# Patient Record
Sex: Female | Born: 1970 | Race: Black or African American | Hispanic: No | Marital: Single | Smoking: Current every day smoker
Health system: Southern US, Community
[De-identification: ages and names within clinical notes are randomized; demographics above are authoritative.]

## PROBLEM LIST (undated history)

## (undated) ENCOUNTER — Emergency Department (HOSPITAL_COMMUNITY): Payer: Self-pay | Source: Home / Self Care

## (undated) DIAGNOSIS — A599 Trichomoniasis, unspecified: Secondary | ICD-10-CM

## (undated) DIAGNOSIS — N83209 Unspecified ovarian cyst, unspecified side: Secondary | ICD-10-CM

## (undated) DIAGNOSIS — D649 Anemia, unspecified: Secondary | ICD-10-CM

## (undated) DIAGNOSIS — D259 Leiomyoma of uterus, unspecified: Secondary | ICD-10-CM

## (undated) DIAGNOSIS — A549 Gonococcal infection, unspecified: Secondary | ICD-10-CM

## (undated) HISTORY — PX: DILATION AND CURETTAGE OF UTERUS: SHX78

## (undated) HISTORY — DX: Unspecified ovarian cyst, unspecified side: N83.209

## (undated) HISTORY — DX: Leiomyoma of uterus, unspecified: D25.9

---

## 1997-06-17 ENCOUNTER — Encounter: Admission: RE | Admit: 1997-06-17 | Discharge: 1997-06-17 | Payer: Self-pay | Admitting: Family Medicine

## 1998-06-15 ENCOUNTER — Ambulatory Visit (HOSPITAL_COMMUNITY): Admission: AD | Admit: 1998-06-15 | Discharge: 1998-06-15 | Payer: Self-pay | Admitting: *Deleted

## 1998-07-07 ENCOUNTER — Other Ambulatory Visit: Admission: RE | Admit: 1998-07-07 | Discharge: 1998-07-07 | Payer: Self-pay | Admitting: *Deleted

## 1998-10-07 ENCOUNTER — Encounter (INDEPENDENT_AMBULATORY_CARE_PROVIDER_SITE_OTHER): Payer: Self-pay | Admitting: *Deleted

## 1998-10-07 LAB — CONVERTED CEMR LAB

## 1999-06-14 ENCOUNTER — Encounter: Admission: RE | Admit: 1999-06-14 | Discharge: 1999-06-14 | Payer: Self-pay | Admitting: Family Medicine

## 1999-06-20 ENCOUNTER — Encounter: Admission: RE | Admit: 1999-06-20 | Discharge: 1999-06-20 | Payer: Self-pay | Admitting: Family Medicine

## 1999-06-22 ENCOUNTER — Encounter: Admission: RE | Admit: 1999-06-22 | Discharge: 1999-06-22 | Payer: Self-pay | Admitting: Family Medicine

## 1999-07-24 ENCOUNTER — Encounter: Admission: RE | Admit: 1999-07-24 | Discharge: 1999-07-24 | Payer: Self-pay | Admitting: Sports Medicine

## 1999-08-21 ENCOUNTER — Encounter: Payer: Self-pay | Admitting: Emergency Medicine

## 1999-08-21 ENCOUNTER — Emergency Department (HOSPITAL_COMMUNITY): Admission: EM | Admit: 1999-08-21 | Discharge: 1999-08-21 | Payer: Self-pay | Admitting: Emergency Medicine

## 2002-08-11 ENCOUNTER — Emergency Department (HOSPITAL_COMMUNITY): Admission: EM | Admit: 2002-08-11 | Discharge: 2002-08-11 | Payer: Self-pay | Admitting: Emergency Medicine

## 2002-10-02 ENCOUNTER — Inpatient Hospital Stay (HOSPITAL_COMMUNITY): Admission: AD | Admit: 2002-10-02 | Discharge: 2002-10-03 | Payer: Self-pay | Admitting: *Deleted

## 2002-10-02 ENCOUNTER — Encounter (INDEPENDENT_AMBULATORY_CARE_PROVIDER_SITE_OTHER): Payer: Self-pay

## 2002-10-02 ENCOUNTER — Encounter: Payer: Self-pay | Admitting: *Deleted

## 2004-08-22 ENCOUNTER — Emergency Department (HOSPITAL_COMMUNITY): Admission: EM | Admit: 2004-08-22 | Discharge: 2004-08-23 | Payer: Self-pay | Admitting: Emergency Medicine

## 2005-09-04 ENCOUNTER — Emergency Department (HOSPITAL_COMMUNITY): Admission: EM | Admit: 2005-09-04 | Discharge: 2005-09-04 | Payer: Self-pay | Admitting: Emergency Medicine

## 2005-11-28 ENCOUNTER — Encounter (INDEPENDENT_AMBULATORY_CARE_PROVIDER_SITE_OTHER): Payer: Self-pay | Admitting: Family Medicine

## 2005-11-28 ENCOUNTER — Ambulatory Visit: Payer: Self-pay | Admitting: Family Medicine

## 2005-11-29 ENCOUNTER — Ambulatory Visit: Payer: Self-pay | Admitting: *Deleted

## 2006-04-05 ENCOUNTER — Encounter (INDEPENDENT_AMBULATORY_CARE_PROVIDER_SITE_OTHER): Payer: Self-pay | Admitting: *Deleted

## 2006-10-23 ENCOUNTER — Encounter (INDEPENDENT_AMBULATORY_CARE_PROVIDER_SITE_OTHER): Payer: Self-pay | Admitting: *Deleted

## 2006-10-30 DIAGNOSIS — R109 Unspecified abdominal pain: Secondary | ICD-10-CM

## 2006-10-30 HISTORY — DX: Unspecified abdominal pain: R10.9

## 2007-04-05 ENCOUNTER — Emergency Department (HOSPITAL_COMMUNITY): Admission: EM | Admit: 2007-04-05 | Discharge: 2007-04-05 | Payer: Self-pay | Admitting: Emergency Medicine

## 2008-06-15 ENCOUNTER — Emergency Department (HOSPITAL_COMMUNITY): Admission: EM | Admit: 2008-06-15 | Discharge: 2008-06-15 | Payer: Self-pay | Admitting: Emergency Medicine

## 2009-05-13 ENCOUNTER — Emergency Department (HOSPITAL_COMMUNITY): Admission: EM | Admit: 2009-05-13 | Discharge: 2009-05-13 | Payer: Self-pay | Admitting: Emergency Medicine

## 2009-09-02 ENCOUNTER — Emergency Department (HOSPITAL_COMMUNITY): Admission: EM | Admit: 2009-09-02 | Discharge: 2009-09-02 | Payer: Self-pay | Admitting: Emergency Medicine

## 2010-04-22 LAB — BASIC METABOLIC PANEL
CO2: 24 mEq/L (ref 19–32)
Calcium: 8.3 mg/dL — ABNORMAL LOW (ref 8.4–10.5)
GFR calc Af Amer: >60 mL/min (ref >60)
Potassium: 3.4 mEq/L — ABNORMAL LOW (ref 3.5–5.1)

## 2010-04-22 LAB — DIFFERENTIAL
Lymphocytes Relative: 8 % — ABNORMAL LOW (ref 12–46)
Lymphs Abs: 0.9 10*3/uL (ref 0.7–4.0)
Monocytes Relative: 3 % (ref 3–12)
Neutrophils Relative %: 89 % — ABNORMAL HIGH (ref 43–77)

## 2010-04-22 LAB — CBC
HCT: 35.6 % — ABNORMAL LOW (ref 36.0–46.0)
MCH: 28.4 pg (ref 26.0–34.0)
MCHC: 33.1 g/dL (ref 30.0–36.0)
RBC: 4.16 MIL/uL (ref 3.87–5.11)
WBC: 11.2 10*3/uL — ABNORMAL HIGH (ref 4.0–10.5)

## 2010-04-22 LAB — URINALYSIS, ROUTINE W REFLEX MICROSCOPIC
Bilirubin Urine: NEGATIVE
Glucose, UA: NEGATIVE mg/dL
Ketones, ur: NEGATIVE mg/dL
Leukocytes, UA: NEGATIVE
Nitrite: NEGATIVE
Urobilinogen, UA: 0.2 mg/dL (ref 0.0–1.0)
pH: 5 (ref 5.0–8.0)

## 2010-04-22 LAB — WET PREP, GENITAL
Clue Cells Wet Prep HPF POC: NONE SEEN
Yeast Wet Prep HPF POC: NONE SEEN

## 2010-04-22 LAB — GC/CHLAMYDIA PROBE AMP, GENITAL: GC Probe Amp, Genital: NEGATIVE

## 2010-04-22 LAB — URINE MICROSCOPIC-ADD ON

## 2010-04-22 LAB — POCT PREGNANCY, URINE: Preg Test, Ur: NEGATIVE

## 2010-05-16 LAB — POCT I-STAT, CHEM 8
Creatinine, Ser: 1 mg/dL (ref 0.4–1.2)
Hemoglobin: 12.9 g/dL (ref 12.0–15.0)
Potassium: 4 mEq/L (ref 3.5–5.1)
TCO2: 26 mmol/L (ref 0–100)

## 2010-05-16 LAB — URINALYSIS, ROUTINE W REFLEX MICROSCOPIC
Bilirubin Urine: NEGATIVE
Nitrite: NEGATIVE
Specific Gravity, Urine: 1.01 (ref 1.005–1.030)
Urobilinogen, UA: 0.2 mg/dL (ref 0.0–1.0)
pH: 6.5 (ref 5.0–8.0)

## 2010-05-16 LAB — DIFFERENTIAL
Basophils Absolute: 0 10*3/uL (ref 0.0–0.1)
Eosinophils Relative: 1 % (ref 0–5)
Lymphocytes Relative: 22 % (ref 12–46)
Lymphs Abs: 1.7 10*3/uL (ref 0.7–4.0)
Monocytes Absolute: 0.3 10*3/uL (ref 0.1–1.0)
Monocytes Relative: 4 % (ref 3–12)

## 2010-05-16 LAB — CBC
Hemoglobin: 11.1 g/dL — ABNORMAL LOW (ref 12.0–15.0)
RDW: 17.9 % — ABNORMAL HIGH (ref 11.5–15.5)

## 2011-01-10 ENCOUNTER — Encounter (HOSPITAL_COMMUNITY): Payer: Self-pay | Admitting: *Deleted

## 2011-01-10 ENCOUNTER — Inpatient Hospital Stay (HOSPITAL_COMMUNITY): Payer: Self-pay

## 2011-01-10 ENCOUNTER — Inpatient Hospital Stay (HOSPITAL_COMMUNITY)
Admission: AD | Admit: 2011-01-10 | Discharge: 2011-01-10 | Disposition: A | Discharge status: Home / Self Care | Payer: Self-pay | Source: Ambulatory Visit | Attending: Obstetrics & Gynecology | Admitting: Obstetrics & Gynecology

## 2011-01-10 DIAGNOSIS — N83209 Unspecified ovarian cyst, unspecified side: Secondary | ICD-10-CM

## 2011-01-10 DIAGNOSIS — N760 Acute vaginitis: Secondary | ICD-10-CM

## 2011-01-10 DIAGNOSIS — N83201 Unspecified ovarian cyst, right side: Secondary | ICD-10-CM

## 2011-01-10 DIAGNOSIS — N8300 Follicular cyst of ovary, unspecified side: Secondary | ICD-10-CM | POA: Insufficient documentation

## 2011-01-10 DIAGNOSIS — D259 Leiomyoma of uterus, unspecified: Secondary | ICD-10-CM

## 2011-01-10 DIAGNOSIS — R109 Unspecified abdominal pain: Secondary | ICD-10-CM | POA: Insufficient documentation

## 2011-01-10 DIAGNOSIS — A499 Bacterial infection, unspecified: Secondary | ICD-10-CM | POA: Insufficient documentation

## 2011-01-10 DIAGNOSIS — B9689 Other specified bacterial agents as the cause of diseases classified elsewhere: Secondary | ICD-10-CM | POA: Insufficient documentation

## 2011-01-10 HISTORY — DX: Trichomoniasis, unspecified: A59.9

## 2011-01-10 HISTORY — DX: Gonococcal infection, unspecified: A54.9

## 2011-01-10 LAB — WET PREP, GENITAL
Trich, Wet Prep: NONE SEEN
Yeast Wet Prep HPF POC: NONE SEEN

## 2011-01-10 LAB — URINALYSIS, ROUTINE W REFLEX MICROSCOPIC
Bilirubin Urine: NEGATIVE
Glucose, UA: NEGATIVE mg/dL
Ketones, ur: NEGATIVE mg/dL
Nitrite: NEGATIVE
Protein, ur: NEGATIVE mg/dL
Urobilinogen, UA: 0.2 mg/dL (ref 0.0–1.0)

## 2011-01-10 LAB — URINE MICROSCOPIC-ADD ON

## 2011-01-10 MED ORDER — KETOROLAC TROMETHAMINE 60 MG/2ML IM SOLN
60.0000 mg | Freq: Once | INTRAMUSCULAR | Status: AC
Start: 2011-01-10 — End: 2011-01-10
  Administered 2011-01-10: 60 mg via INTRAMUSCULAR
  Filled 2011-01-10: qty 2

## 2011-01-10 MED ORDER — METRONIDAZOLE 500 MG PO TABS: 500.0000 mg | ORAL_TABLET | Freq: Two times a day (BID) | ORAL | Status: AC

## 2011-01-10 MED ORDER — IBUPROFEN 600 MG PO TABS: 600.0000 mg | ORAL_TABLET | Freq: Four times a day (QID) | ORAL | Status: AC | PRN

## 2011-01-10 MED ORDER — OXYCODONE-ACETAMINOPHEN 5-325 MG PO TABS: 2.0000 | ORAL_TABLET | ORAL | Status: AC | PRN

## 2011-01-10 NOTE — ED Provider Notes (Signed)
History     Chief Complaint  Patient presents with  . Abdominal Pain   HPI Elaine Blake 40 y.o. Having abdominal pain.  Has known fibroids and has not seen a doctor for them in several years.  Was in ER in July 2011.  Was advised to follow up with OB/GYN but did not.  Thinks this pain is her fibroids again.  Took aspirin yesterday.  No pain medication today.  Is rocking in bed currently.  Same partner for past 5 years.  OB History    Grav Para Term Preterm Abortions TAB SAB Ect Mult Living   6 1 1  0 4 2 2  0 0 1      Past Medical History  Diagnosis Date  . Gonorrhea   . Trichomonas     Past Surgical History  Procedure Date  . Dilation and curettage of uterus     Family History  Problem Relation Age of Onset  . Anesthesia problems Neg Hx     History  Substance Use Topics  . Smoking status: Current Everyday Smoker -- 0.5 packs/day  . Smokeless tobacco: Never Used  . Alcohol Use: No    Allergies:  Allergies  Allergen Reactions  . Latex     Fever blisters on mouth    Prescriptions prior to admission  Medication Sig Dispense Refill  . aspirin 325 MG EC tablet Take 650 mg by mouth daily as needed. Pain        . ibuprofen (ADVIL,MOTRIN) 200 MG tablet Take 400 mg by mouth every 6 (six) hours as needed. pain       . Multiple Vitamins-Iron (MULTIVITAMIN/IRON) TABS Take 1 tablet by mouth daily.          Review of Systems  Gastrointestinal: Positive for abdominal pain.       No vaginal bleeding Vaginal discharge x 2 weeks   Physical Exam   Blood pressure 136/97, pulse 73, temperature 99 F (37.2 C), temperature source Oral, resp. rate 18, height 5' 2.5" (1.588 m), weight 163 lb 9.6 oz (74.208 kg), last menstrual period 01/02/2011.  Physical Exam  Nursing note and vitals reviewed. Constitutional: She is oriented to person, place, and time. She appears well-developed and well-nourished.  HENT:  Head: Normocephalic.  Eyes: EOM are normal.  Neck: Neck supple.   GI: Soft. There is tenderness.       Fundus to umbilicus Abdomen tender to palpation in RLQ  Genitourinary:       Speculum exam: Vulva - negative Vagina - Small amount of creamy discharge, odor noted Cervix - No contact bleeding Bimanual exam: Cervix closed Uterus mildly tender, enlarged Adnexa right very tender GC/Chlam, wet prep done Chaperone present for exam.  Musculoskeletal: Normal range of motion.  Neurological: She is alert and oriented to person, place, and time.  Skin: Skin is warm and dry.  Psychiatric: She has a normal mood and affect.    MAU Course  Procedures  MDM Ultrasound Clinical Data: Pelvic pain and cramping. Endometriosis. LMP  01/04/2011.  TRANSABDOMINAL AND TRANSVAGINAL ULTRASOUND OF PELVIS  Technique: Both transabdominal and transvaginal ultrasound  examinations of the pelvis were performed. Transabdominal  technique was performed for global imaging of the pelvis including  uterus, ovaries, adnexal regions, and pelvic cul-de-sac.  It was necessary to proceed with endovaginal exam following the  transabdominal exam to visualize the endometrium and ovaries.  Comparison: 09/02/2009  Findings:  Uterus: 9.7 x 6.5 x 6.8 cm. A heterogeneous myometrial mass is  seen in the posterior uterine body which measures approximately 5.5  x 6.2 by 5.8 cm. This shows mild increase in size since previous  study, and mayrepresent a fibroid or adenomyoma.  Endometrium: Double layer thickness measures 8 mm transvaginally.  Right ovary: 3.0 x 1.9 x 2.9 cm. A benign hemorrhagic cyst  containing a hematocrit level is seen which measures 2.0 x 2.5 x  2.6 cm.  Left ovary: 3.8 x 2.2 x 2.6 cm. Normal appearance.  Other Findings: No free fluid  IMPRESSION:  1. Mild enlargement of 6 cm posterior uterine fibroid versus  adenomyoma. Pelvic MRI could be performed for further  characterization if clinically warranted.  2. 2.6 cm right ovarian hemorrhagic cyst.  3. Normal  appearance of the left ovary.  Results for orders placed during the hospital encounter of 01/10/11 (from the past 24 hour(s))  POCT PREGNANCY, URINE     Status: Normal   Collection Time   01/10/11  2:23 PM      Component Value Range   Preg Test, Ur NEGATIVE    URINALYSIS, ROUTINE W REFLEX MICROSCOPIC     Status: Abnormal   Collection Time   01/10/11  2:40 PM      Component Value Range   Color, Urine YELLOW  YELLOW    APPearance HAZY (*) CLEAR    Specific Gravity, Urine 1.025  1.005 - 1.030    pH 5.5  5.0 - 8.0    Glucose, UA NEGATIVE  NEGATIVE (mg/dL)   Hgb urine dipstick MODERATE (*) NEGATIVE    Bilirubin Urine NEGATIVE  NEGATIVE    Ketones, ur NEGATIVE  NEGATIVE (mg/dL)   Protein, ur NEGATIVE  NEGATIVE (mg/dL)   Urobilinogen, UA 0.2  0.0 - 1.0 (mg/dL)   Nitrite NEGATIVE  NEGATIVE    Leukocytes, UA NEGATIVE  NEGATIVE   URINE MICROSCOPIC-ADD ON     Status: Abnormal   Collection Time   01/10/11  2:40 PM      Component Value Range   Squamous Epithelial / LPF MANY (*) RARE    RBC / HPF 0-2  <3 (RBC/hpf)   Bacteria, UA FEW (*) RARE   WET PREP, GENITAL     Status: Abnormal   Collection Time   01/10/11  4:40 PM      Component Value Range   Yeast, Wet Prep NONE SEEN  NONE SEEN    Trich, Wet Prep NONE SEEN  NONE SEEN    Clue Cells, Wet Prep MODERATE (*) NONE SEEN    WBC, Wet Prep HPF POC MANY (*) NONE SEEN     Assessment and Plan  Uterine fibroid Right ovarian hemorrhagic cyst Bacterial vaginosis  Plan Will prescribe ibuprofen pain management and percocet for severe pain Will send message to GYN clinic for follow up appointment Encouraged client to keep appointment and be seen for a management plan.  Elaine Blake 01/10/2011, 3:25 PM   Nolene Bernheim, NP 01/10/11 1710

## 2011-01-10 NOTE — Progress Notes (Signed)
Nausea earlier today.  No other GI problems. Denies any urinary problems.  Denies hx of kidney stone.  Neg CVA tenderness.

## 2011-01-10 NOTE — Progress Notes (Signed)
Rt lower quad pain.  Sharp shooting pain, into low back.  Not as bad now as it was last night.

## 2011-02-07 ENCOUNTER — Encounter: Payer: Self-pay | Admitting: Obstetrics & Gynecology

## 2011-03-01 ENCOUNTER — Ambulatory Visit (INDEPENDENT_AMBULATORY_CARE_PROVIDER_SITE_OTHER): Payer: Self-pay | Admitting: Obstetrics and Gynecology

## 2011-03-01 ENCOUNTER — Encounter: Payer: Self-pay | Admitting: Obstetrics and Gynecology

## 2011-03-01 ENCOUNTER — Other Ambulatory Visit (HOSPITAL_COMMUNITY)
Admission: RE | Admit: 2011-03-01 | Discharge: 2011-03-01 | Disposition: A | Discharge status: Home / Self Care | Payer: Self-pay | Source: Ambulatory Visit | Attending: Obstetrics and Gynecology | Admitting: Obstetrics and Gynecology

## 2011-03-01 DIAGNOSIS — D259 Leiomyoma of uterus, unspecified: Secondary | ICD-10-CM | POA: Insufficient documentation

## 2011-03-01 DIAGNOSIS — N920 Excessive and frequent menstruation with regular cycle: Secondary | ICD-10-CM

## 2011-03-01 DIAGNOSIS — N946 Dysmenorrhea, unspecified: Secondary | ICD-10-CM

## 2011-03-01 DIAGNOSIS — Z01419 Encounter for gynecological examination (general) (routine) without abnormal findings: Secondary | ICD-10-CM

## 2011-03-01 DIAGNOSIS — Z01812 Encounter for preprocedural laboratory examination: Secondary | ICD-10-CM

## 2011-03-01 LAB — POCT PREGNANCY, URINE: Preg Test, Ur: NEGATIVE

## 2011-03-01 NOTE — Progress Notes (Signed)
Subjective:    Patient ID: Elaine Blake, female    DOB: [birth year], 41 y.o.   MRN: [id number]  HPI  41 yo [id number] with LMP 02/25/2011 and BMI 31 presenting today as MAU follow-up. Patient was seen on December 5 for pelvic pain and diagnosed with fibroid uterus. Patient reports improvement in her pain since the ed visit but pain is worst around the time of her period. Patient reports regular menses but heavy with passage of clots, lasting 6-7 days.   Review of Systems     Objective:   Physical Exam GENERAL: Well-developed, well-nourished female in no acute distress.  HEENT: Normocephalic, atraumatic. Sclerae anicteric.  NECK: Supple. Normal thyroid.  LUNGS: Clear to auscultation bilaterally.  HEART: Regular rate and rhythm. BREASTS: Symmetric in size. No palpable masses or lymphadenopathy, skin changes, or nipple drainage. ABDOMEN: Soft, nontender, nondistended. No organomegaly. PELVIC: Normal external female genitalia. Vagina is pink and rugated.  Normal discharge. Normal appearing cervix. Uterus is normal in size. No adnexal mass or tenderness. EXTREMITIES: No cyanosis, clubbing, or edema, 2+ distal pulses.  01/10/2011 ultrasound: Findings: Uterus: 9.7 x 6.5 x 6.8 cm. A heterogeneous myometrial mass is  seen in the posterior uterine body which measures approximately 5.5 x 6.2 by 5.8 cm. This shows mild increase in size since previous study, and mayrepresent a fibroid or adenomyoma. Endometrium: Double layer thickness measures 8 mm transvaginally. Right ovary: 3.0 x 1.9 x 2.9 cm. A benign hemorrhagic cyst containing a hematocrit level is seen which measures 2.0 x 2.5 x  2.6 cm. Left ovary: 3.8 x 2.2 x 2.6 cm. Normal appearance. Other Findings: No free fluid   IMPRESSION:  1. Mild enlargement of 6 cm posterior uterine fibroid versus  adenomyoma. Pelvic MRI could be performed for further  characterization if clinically warranted.  2. 2.6 cm right ovarian hemorrhagic cyst.  3. Normal  appearance of the left ovary.     Assessment & Plan:  41 yo [phone] with fibroid uterus - pap smear and endometrial biopsy performed ENDOMETRIAL BIOPSY     The indications for endometrial biopsy were reviewed.   Risks of the biopsy including cramping, bleeding, infection, uterine perforation, inadequate specimen and need for additional procedures  were discussed. The patient states she understands and agrees to undergo procedure today. Consent was signed. Time out was performed. Urine HCG was negative. A sterile speculum was placed in the patient's vagina and the cervix was prepped with Betadine. A single-toothed tenaculum was placed on the anterior lip of the cervix to stabilize it. The uterine cavity was sounded to a depth of 10 cm using the uterine sound. The 3 mm pipelle was introduced into the endometrial cavity without difficulty, 1 pass were made.  A  moderate amount of tissue was  sent to pathology. The instruments were removed from the patient's vagina. Minimal bleeding from the cervix was noted. The patient tolerated the procedure well.  Routine post-procedure instructions were given to the patient. The patient will follow up in two weeks to review the results and for further management.   -Treatment options were discussed (medical and surgical). Patient reports that she is actively trying to conceive with new partner and does not desire surgical management. Patient therefore offered medical management with ibuprofen a few days before her menses and during her menses to help alleviate the pain. - Patient advised to take PNV and to return to [place] if 6 months of infertility has occurred - patient will return in a month for  results and follow-up on her dysmenorrhea

## 2011-03-01 NOTE — Progress Notes (Signed)
Ibuprofen 600 mg given postprocedure for cramping=5 at 4:07 pm

## 2011-03-05 ENCOUNTER — Telehealth: Payer: Self-pay | Admitting: *Deleted

## 2011-03-05 NOTE — Telephone Encounter (Signed)
Pt left message stating that she was here on 03/01/11. Now she is having d/c and pain. Please call back.

## 2011-03-06 NOTE — Telephone Encounter (Signed)
Returned pt call- left message that I am calling to check on her status and what advice I might be able to provide her. Please call back with a new message to the nurse voice mail.

## 2011-03-08 NOTE — Telephone Encounter (Signed)
Called patient and left message stating this is the second time we have returned your call, if you still need assistance please call back and leave another message with how we may help you and best number to reach you.

## 2011-03-30 ENCOUNTER — Ambulatory Visit (INDEPENDENT_AMBULATORY_CARE_PROVIDER_SITE_OTHER): Payer: Self-pay | Admitting: Obstetrics and Gynecology

## 2011-03-30 ENCOUNTER — Encounter: Payer: Self-pay | Admitting: Obstetrics and Gynecology

## 2011-03-30 DIAGNOSIS — N920 Excessive and frequent menstruation with regular cycle: Secondary | ICD-10-CM

## 2011-03-30 DIAGNOSIS — D259 Leiomyoma of uterus, unspecified: Secondary | ICD-10-CM

## 2011-03-30 DIAGNOSIS — N946 Dysmenorrhea, unspecified: Secondary | ICD-10-CM

## 2011-03-30 MED ORDER — IBUPROFEN 800 MG PO TABS: 800.0000 mg | ORAL_TABLET | Freq: Three times a day (TID) | ORAL | Status: AC | PRN

## 2011-03-30 MED ORDER — OXYCODONE-ACETAMINOPHEN 5-325 MG PO TABS: 1.0000 | ORAL_TABLET | ORAL | Status: AC | PRN

## 2011-03-30 NOTE — Progress Notes (Signed)
41 yo [id number] with dysmenorrhea and fibroid uterus presenting today for follow-up. Results of endometrial biopsy discussed and explained to the patient. Patient reports persistent pain not relieved with ibuprofen and is ready for surgical intervention. Patient desires to conceive and therefore is interested in myomectomy. Risk, benefits and alternatives were explained including but not limited to risk of bleeding, infection and damage to adjacent organs. Patient verbalized understanding. Patient will be scheduled for myomectomy. Rx percocet and 800mg  Ibuprofen provided

## 2011-03-30 NOTE — Patient Instructions (Signed)
Myomectomy     Myoma is a non-cancerous tumor made up of fibrous tissue. It is also called leiomyoma, but more often called a fibroid tumor. Myomectomy is the removal of a fibroid tumor without removing another organ, like the uterus or ovary, with it. Fibroids range from the size of a pea to a grapefruit. They are rarely cancerous. Myomas only need treatment when they are growing or when they cause symptoms, such aspain, pressure, bleeding, and pain with intercourse.  LET YOUR CAREGIVER KNOW ABOUT:  · Any allergies, especially to medicines.   · If you develop a cold or an infection before your surgery.   · Medicines taken, including vitamins, herbs, eyedrops, over-ther-counter medicines, and creams.   · Use of steroids (by mouth or creams).   · Previous problems with numbing medicines.   · History of blood clots or other bleeding problems.   · Other health problems, such as diabetes, kidney, heart, or lung problems.   · Previous surgery.   · Possibility of pregnancy, if this applies.   RISKS AND COMPLICATIONS   · Excessive bleeding.   · Infection.   · Injury to other organs.   · Blood clots in the legs, chest, and brain.   · Scar tissue (adhesions) on other organs and in the pelvis.   · Death during or after the surgery.   BEFORE THE PROCEDURE  · Follow your caregiver's advice regarding your surgery and preparing for surgery.   · Avoid taking aspirin or blood thinners as directed by your caregiver.   · DO NOT eat or drink anything after midnight on the night before surgery, or as directed by your caregiver.   · DO NOT smoke (if you smoke) for 2 weeks before the surgery.   · DO NOT drink alcohol the day before the surgery.   · If you are admitted the day of the surgery,arrive1 hour before your surgery is scheduled.   · Arrange to have someone take you home from the hospital.   · Arrange to have someone care for you when you go home.   PROCEDURE  There are several ways to perform a myomectomy:  · Hysteroscopy  myomectomy. A lighted tube is inserted inside the uterus. The tube will remove the fibroid. This is used when the fibroid is inside the cavity of the uterus.   · Laparoscopic myomectomy. A long, lighted tube is inserted through 2 or 3 small incisions to see the organs in the pelvis. The fibroid is removed.   · Myomectomy through a sugical cut (incicion) in the abdomen. The fibroid is removed through an incision made in the stomach. This way is performed when thethe fibroid cannot be removed with a hysteroscope or laprascope.   AFTER THE PROCEDURE  · If you had laparoscopic or hysteroscopic myomectomy, you may go home the same day or stay overnight.   · If you had abdominal myomectomy, you may stay in the hospital a few days.   · Your intravenous (IV)access tube and catheter will be removed in 1 or 2 days.   · If you stay in the hospital, your caregiver will order pain medicine and a sleeping pill, if needed.   · You may be placed on an antibiotic medicine, if needed.   · You may be given written instructions and medicines before you are sent home.   Document Released: 11/19/2006 Document Revised: 10/04/2010 Document Reviewed: 12/01/2008  [employer]® Patient Information ©2012 [employer].

## 2011-04-03 IMAGING — CT CT ABD-PELV W/ CM
2 of 4 series · 17 of 46 positions shown, 19 images · IV contrast (omnipaque)
Comparison: CT abdomen 09/04/2005

CLINICAL DATA: Abdominal pain and cramping, right lower quadrant
pain.

CT ABDOMEN AND PELVIS WITH CONTRAST
TECHNIQUE: Multidetector CT imaging of the abdomen and pelvis was
performed following the standard protocol during bolus
administration of intravenous contrast.
Contrast: 80 ml Omnipaque 300

[Series 5: abd/pelv with 3.0 spo cor st · coronal · 0.90mm/px · 3 of 66 slices shown]
[im 22/66  soft-tissue]
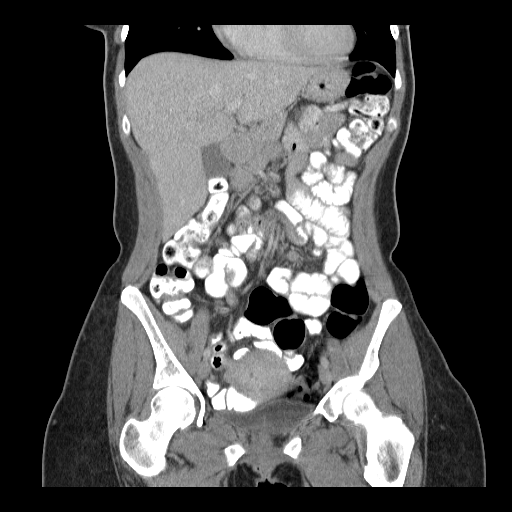
[im 29/66  soft-tissue]
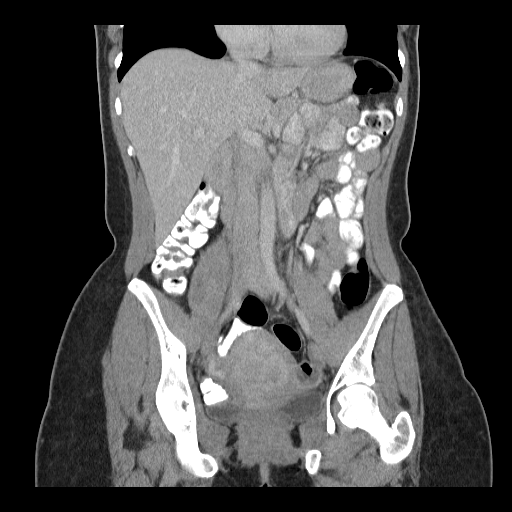
[im 37/66  soft-tissue]
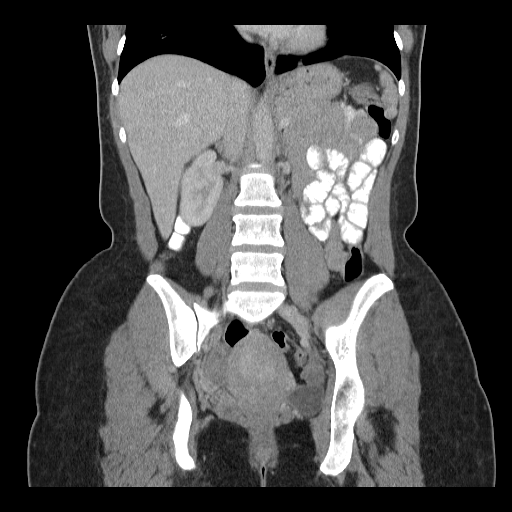

[Series 7: abd/pelv with 5.0 b31f st · axial · 0.67mm/px · z∈[-707,-327]mm · 14 of 84 slices shown, 16 images]
[im 4/84  soft-tissue]
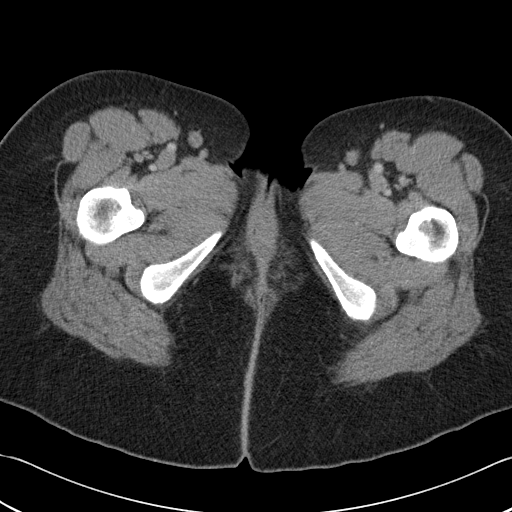
[im 4/84  bone]
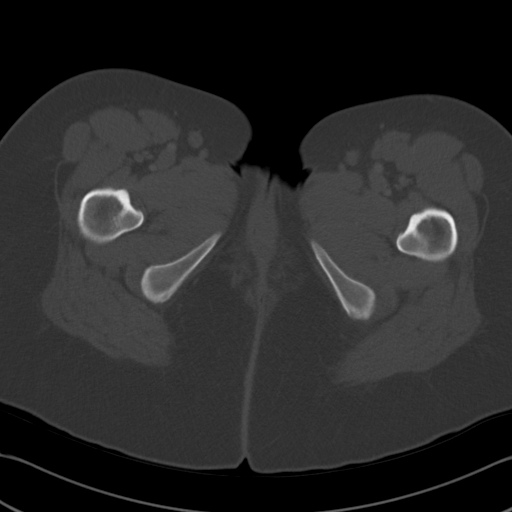
[im 10/84  soft-tissue]
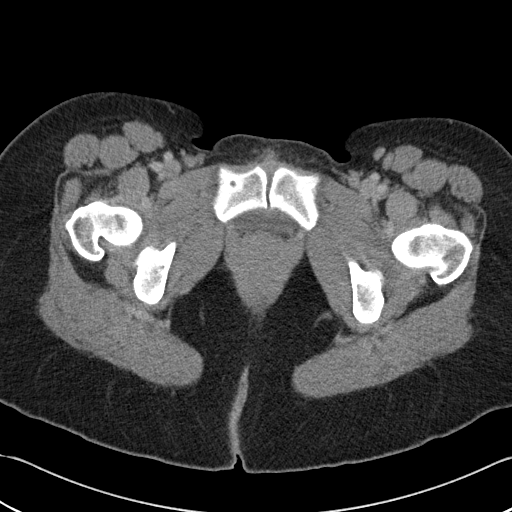
[im 17/84  soft-tissue]
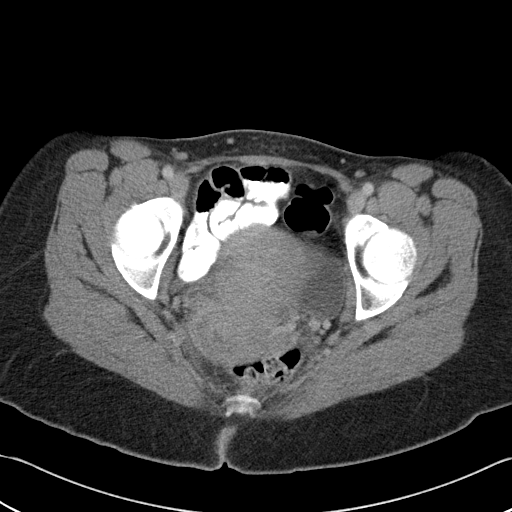
[im 24/84  soft-tissue]
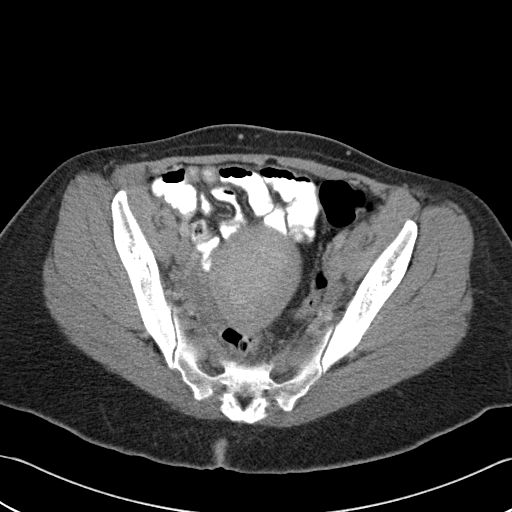
[im 27/84  soft-tissue]
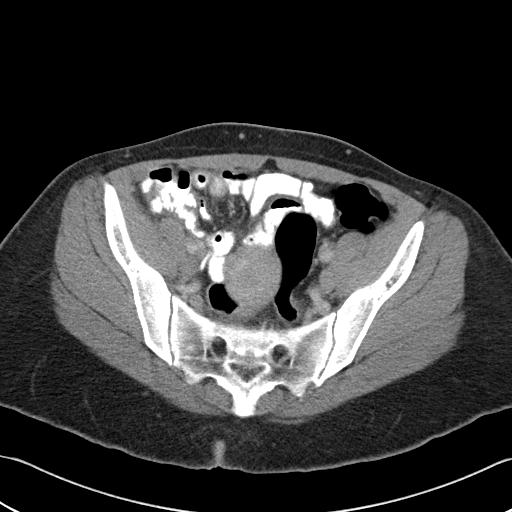
[im 34/84  soft-tissue]
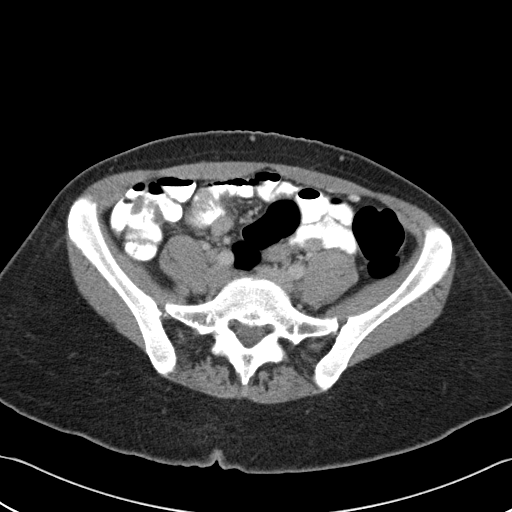
[im 40/84  soft-tissue]
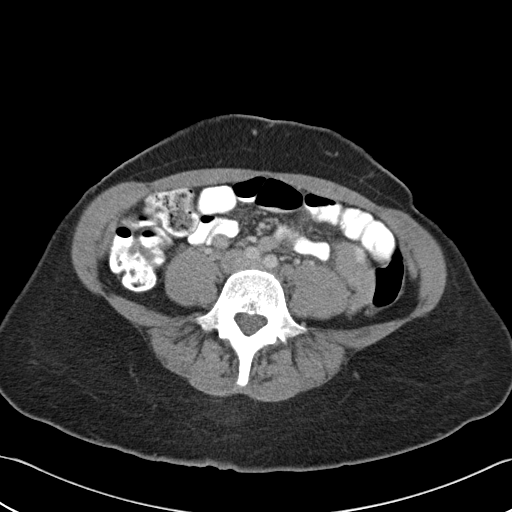
[im 44/84  soft-tissue]
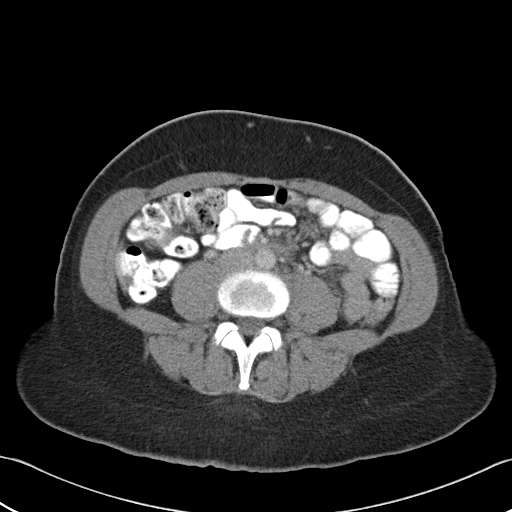
[im 50/84  soft-tissue]
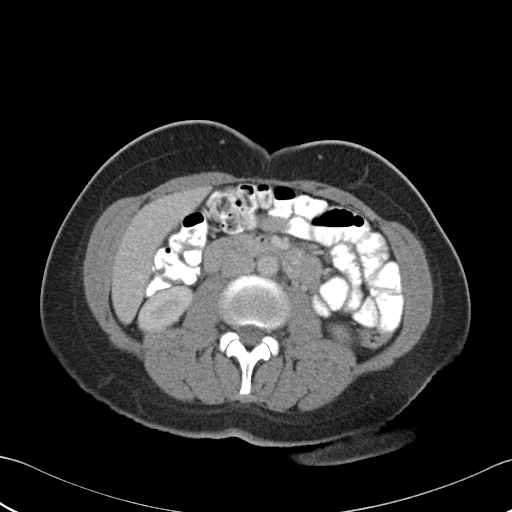
[im 50/84  bone]
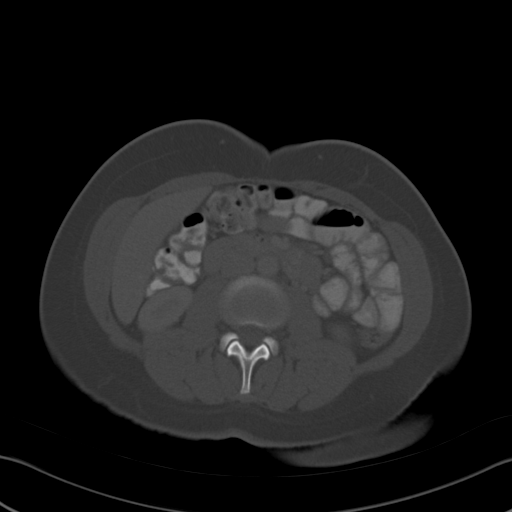
[im 57/84  soft-tissue]
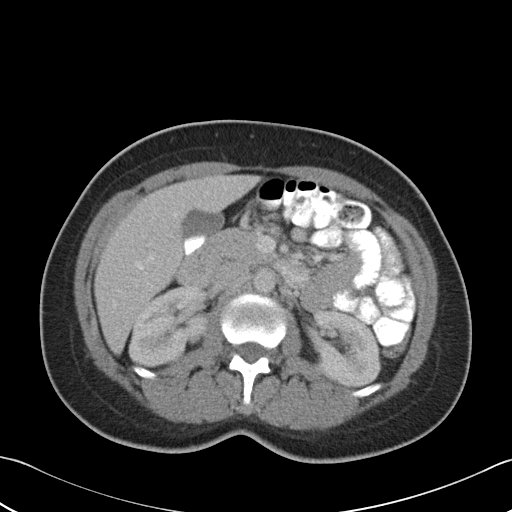
[im 64/84  soft-tissue]
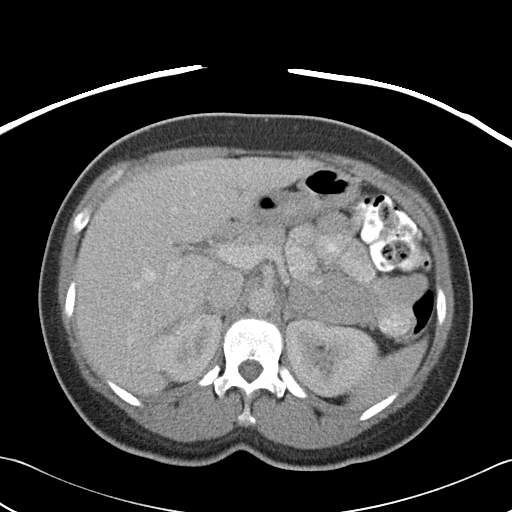
[im 67/84  soft-tissue]
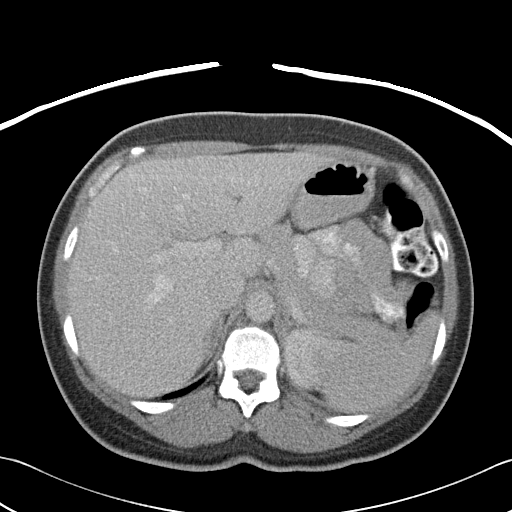
[im 74/84  soft-tissue]
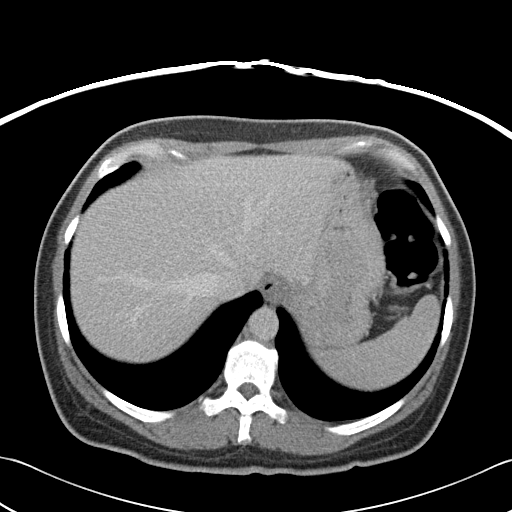
[im 80/84  soft-tissue]
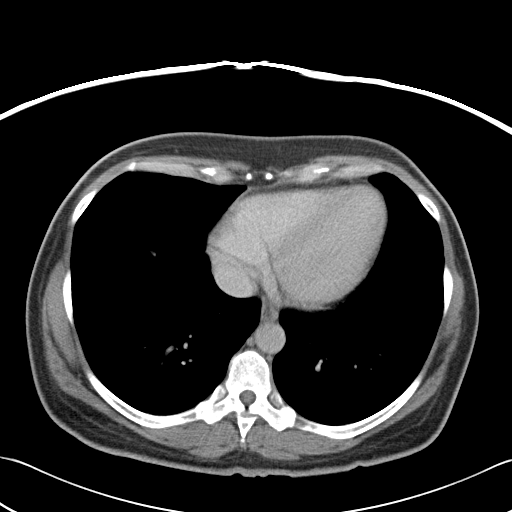

[17 of 46 positions shown; findings below may reference images not displayed]

FINDINGS: Lung bases are clear.  The base of the heart appears
normal.

No focal hepatic lesion.  Gallbladder, pancreas, spleen, adrenal
glands, kidneys appear normal.

The stomach, small bowel, appendix, and cecum are normal.  The
colon and rectosigmoid colon are normal.

Trace amount of free fluid within the pelvis.  The posterior wall
of the uterus is enlarged likely representing a mural leiomyoma.
Ovaries appear normal.  No evidence of pelvic lymphadenopathy.
Review of  bone windows demonstrates no aggressive osseous lesions.
IMPRESSION: 1. Normal appendix.
2.  Small amount free fluid in the pelvis may be physiologic.
3.  Leiomyomatous uterus.

## 2011-05-14 ENCOUNTER — Encounter (HOSPITAL_COMMUNITY): Payer: Self-pay | Admitting: Pharmacist

## 2011-05-17 ENCOUNTER — Encounter (HOSPITAL_COMMUNITY): Payer: Self-pay

## 2011-05-17 ENCOUNTER — Encounter (HOSPITAL_COMMUNITY)
Admission: RE | Admit: 2011-05-17 | Discharge: 2011-05-17 | Disposition: A | Discharge status: Home / Self Care | Payer: Self-pay | Source: Ambulatory Visit | Attending: Obstetrics and Gynecology | Admitting: Obstetrics and Gynecology

## 2011-05-17 ENCOUNTER — Inpatient Hospital Stay (HOSPITAL_COMMUNITY): Admission: RE | Admit: 2011-05-17 | Payer: Self-pay | Source: Ambulatory Visit

## 2011-05-17 HISTORY — DX: Anemia, unspecified: D64.9

## 2011-05-17 LAB — CBC
MCH: 27.2 pg (ref 26.0–34.0)
MCV: 85.2 fL (ref 78.0–100.0)
Platelets: 222 10*3/uL (ref 150–400)
RDW: 15 % (ref 11.5–15.5)
WBC: 6.4 10*3/uL (ref 4.0–10.5)

## 2011-05-17 NOTE — Patient Instructions (Addendum)
YOUR PROCEDURE IS SCHEDULED ON:05/24/11  ENTER THROUGH THE MAIN ENTRANCE OF [facility] AT:1130am  USE DESK PHONE AND DIAL [phone] TO INFORM us OF YOUR ARRIVAL  CALL [phone] IF YOU HAVE ANY QUESTIONS OR PROBLEMS PRIOR TO YOUR ARRIVAL.  REMEMBER: DO NOT EAT AFTER MIDNIGHT :Wed.  SPECIAL INSTRUCTIONS:clear liquids ok until 9am on Thursday   YOU MAY BRUSH YOUR TEETH THE MORNING OF SURGERY   TAKE THESE MEDICINES THE DAY OF SURGERY WITH SIP OF WATER:none   DO NOT WEAR JEWELRY, EYE MAKEUP, LIPSTICK OR DARK FINGERNAIL POLISH DO NOT WEAR LOTIONS  DO NOT SHAVE FOR 48 HOURS PRIOR TO SURGERY  YOU WILL NOT BE ALLOWED TO DRIVE YOURSELF HOME.  NAME OF DRIVER: Nada Boozer [phone]

## 2011-05-23 NOTE — H&P (Signed)
Elaine Blake is an 41 y.o. female [phone] presenting today for scheduled abdominal myomectomy. Patient has been experiencing worsening dysmenorrhea since December and requested surgical intervention with removal of fibroids. Patient desires to preserve her fertility as she is actively trying to conceive.   Pertinent Gynecological History: Menses: flow is moderate and with severe dysmenorrhea Bleeding: monthly Contraception: none DES exposure: denies Blood transfusions: none Sexually transmitted diseases: past history of gonorrhea Previous GYN Procedures: DNC  Last mammogram: n/a Last pap: normal Date: 02/2011 OB History: G6, P1041   Menstrual History: No LMP recorded.    Past Medical History  Diagnosis Date  . Gonorrhea   . Trichomonas   . Fibroid, uterine   . Ovarian cyst   . Anemia     h/o anemia in past    Past Surgical History  Procedure Date  . Dilation and curettage of uterus     Family History  Problem Relation Age of Onset  . Anesthesia problems Neg Hx   . Hypertension Father   . Seizures Son     Social History:  reports that she has been smoking Cigarettes.  She has been smoking about 1 pack per day. She has never used smokeless tobacco. She reports that she does not drink alcohol or use illicit drugs.  Allergies:  Allergies  Allergen Reactions  . Latex     Fever blisters on mouth    Prescriptions prior to admission  Medication Sig Dispense Refill  . ibuprofen (ADVIL,MOTRIN) 800 MG tablet Take 800 mg by mouth every 6 (six) hours as needed. For pain        Review of Systems  All other systems reviewed and are negative.    Blood pressure 118/86, pulse 82, temperature 98.6 F (37 C), resp. rate 18, SpO2 100.00%. Physical Exam GENERAL: Well-developed, well-nourished female in no acute distress.  HEENT: Normocephalic, atraumatic. Sclerae anicteric.  NECK: Supple. Normal thyroid.  LUNGS: Clear to auscultation bilaterally.  HEART: Regular rate  and rhythm. ABDOMEN: Soft, nontender, nondistended. No organomegaly. PELVIC: deferred to OR EXTREMITIES: No cyanosis, clubbing, or edema, 2+ distal pulses.  No results found for this or any previous visit (from the past 24 hour(s)).  No results found. 01/10/2011 ultrasound: Findings: Uterus: 9.7 x 6.5 x 6.8 cm. A heterogeneous myometrial mass is  seen in the posterior uterine body which measures approximately 5.5 x 6.2 by 5.8 cm. This shows mild increase in size since previous study, and mayrepresent a fibroid or adenomyoma. Endometrium: Double layer thickness measures 8 mm transvaginally. Right ovary: 3.0 x 1.9 x 2.9 cm. A benign hemorrhagic cyst containing a hematocrit level is seen which measures 2.0 x 2.5 x  2.6 cm. Left ovary: 3.8 x 2.2 x 2.6 cm. Normal appearance. Other Findings: No free fluid  IMPRESSION:  1. Mild enlargement of 6 cm posterior uterine fibroid versus  adenomyoma. Pelvic MRI could be performed for further  characterization if clinically warranted.  2. 2.6 cm right ovarian hemorrhagic cyst.  3. Normal appearance of the left ovary.     Assessment/Plan: 41 yo [phone] with fibroid uterus who desires to conceive here for scheduled abdominal myomectomy - Risks, benefits and alternatives explained including but not limited to risk of bleeding, infection and damage to adjacent organs. Patient verbalized understanding and all questions were answered. - Patient understands that following this procedure her dysmenorrhea is highly likely to improve.  Kathrene Sinopoli 05/24/2011, 12:15 PM

## 2011-05-24 ENCOUNTER — Encounter (HOSPITAL_COMMUNITY): Payer: Self-pay | Admitting: Anesthesiology

## 2011-05-24 ENCOUNTER — Inpatient Hospital Stay (HOSPITAL_COMMUNITY): Payer: Self-pay | Admitting: Anesthesiology

## 2011-05-24 ENCOUNTER — Encounter (HOSPITAL_COMMUNITY): Payer: Self-pay | Admitting: Advanced Practice Midwife

## 2011-05-24 ENCOUNTER — Inpatient Hospital Stay (HOSPITAL_COMMUNITY)
Admission: RE | Admit: 2011-05-24 | Discharge: 2011-05-26 | DRG: 743 | Disposition: A | Discharge status: Home / Self Care | Payer: Self-pay | Source: Ambulatory Visit | Attending: Obstetrics and Gynecology | Admitting: Obstetrics and Gynecology

## 2011-05-24 ENCOUNTER — Encounter (HOSPITAL_COMMUNITY)
Admission: RE | Disposition: A | Discharge status: Home / Self Care | Payer: Self-pay | Source: Ambulatory Visit | Attending: Obstetrics and Gynecology

## 2011-05-24 DIAGNOSIS — N946 Dysmenorrhea, unspecified: Secondary | ICD-10-CM

## 2011-05-24 DIAGNOSIS — D259 Leiomyoma of uterus, unspecified: Principal | ICD-10-CM

## 2011-05-24 DIAGNOSIS — N8000 Endometriosis of the uterus, unspecified: Secondary | ICD-10-CM | POA: Diagnosis present

## 2011-05-24 DIAGNOSIS — Z01812 Encounter for preprocedural laboratory examination: Secondary | ICD-10-CM

## 2011-05-24 DIAGNOSIS — R109 Unspecified abdominal pain: Secondary | ICD-10-CM

## 2011-05-24 DIAGNOSIS — Z01818 Encounter for other preprocedural examination: Secondary | ICD-10-CM

## 2011-05-24 HISTORY — PX: MYOMECTOMY: SHX85

## 2011-05-24 SURGERY — MYOMECTOMY, ABDOMINAL APPROACH
Anesthesia: General | Site: Abdomen | Wound class: Clean Contaminated

## 2011-05-24 MED ORDER — HYDROMORPHONE HCL PF 1 MG/ML IJ SOLN
0.2500 mg | INTRAMUSCULAR | Status: DC | PRN
  Administered 2011-05-24 (×4): 0.5 mg via INTRAVENOUS

## 2011-05-24 MED ORDER — VASOPRESSIN 20 UNIT/ML IJ SOLN
INTRAMUSCULAR | Status: AC
  Filled 2011-05-24: qty 2

## 2011-05-24 MED ORDER — GLYCOPYRROLATE 0.2 MG/ML IJ SOLN
INTRAMUSCULAR | Status: DC | PRN
  Administered 2011-05-24: .8 mg via INTRAVENOUS

## 2011-05-24 MED ORDER — PROMETHAZINE HCL 25 MG/ML IJ SOLN: 6.2500 mg | INTRAMUSCULAR | Status: DC | PRN

## 2011-05-24 MED ORDER — FENTANYL CITRATE 0.05 MG/ML IJ SOLN
INTRAMUSCULAR | Status: AC
Start: 2011-05-24 — End: 2011-05-24
  Administered 2011-05-24: 50 ug via INTRAVENOUS
  Filled 2011-05-24: qty 2

## 2011-05-24 MED ORDER — DEXAMETHASONE SODIUM PHOSPHATE 10 MG/ML IJ SOLN
INTRAMUSCULAR | Status: AC
  Filled 2011-05-24: qty 1

## 2011-05-24 MED ORDER — LACTATED RINGERS IV SOLN
INTRAVENOUS | Status: DC
  Administered 2011-05-24 – 2011-05-25 (×2): via INTRAVENOUS

## 2011-05-24 MED ORDER — PNEUMOCOCCAL VAC POLYVALENT 25 MCG/0.5ML IJ INJ
0.5000 mL | INJECTION | INTRAMUSCULAR | Status: AC
  Administered 2011-05-25: 0.5 mL via INTRAMUSCULAR
  Filled 2011-05-24: qty 0.5

## 2011-05-24 MED ORDER — NEOSTIGMINE METHYLSULFATE 1 MG/ML IJ SOLN
INTRAMUSCULAR | Status: DC | PRN
  Administered 2011-05-24: 4 mg via INTRAVENOUS

## 2011-05-24 MED ORDER — HYDROMORPHONE 0.3 MG/ML IV SOLN
INTRAVENOUS | Status: DC
  Administered 2011-05-24: 0.999 mg via INTRAVENOUS
  Administered 2011-05-24: 17:00:00 via INTRAVENOUS
  Administered 2011-05-25: 0.799 mg via INTRAVENOUS
  Administered 2011-05-25: 1.19 mg via INTRAVENOUS

## 2011-05-24 MED ORDER — NEOSTIGMINE METHYLSULFATE 1 MG/ML IJ SOLN
INTRAMUSCULAR | Status: AC
  Filled 2011-05-24: qty 10

## 2011-05-24 MED ORDER — ONDANSETRON HCL 4 MG/2ML IJ SOLN
INTRAMUSCULAR | Status: AC
  Filled 2011-05-24: qty 2

## 2011-05-24 MED ORDER — VASOPRESSIN 20 UNIT/ML IJ SOLN
INTRAMUSCULAR | Status: AC
  Filled 2011-05-24: qty 1

## 2011-05-24 MED ORDER — FENTANYL CITRATE 0.05 MG/ML IJ SOLN
INTRAMUSCULAR | Status: AC
  Administered 2011-05-24: 50 ug via INTRAVENOUS
  Filled 2011-05-24: qty 2

## 2011-05-24 MED ORDER — GLYCOPYRROLATE 0.2 MG/ML IJ SOLN
INTRAMUSCULAR | Status: AC
  Filled 2011-05-24: qty 2

## 2011-05-24 MED ORDER — ROCURONIUM BROMIDE 50 MG/5ML IV SOLN
INTRAVENOUS | Status: AC
  Filled 2011-05-24: qty 1

## 2011-05-24 MED ORDER — MIDAZOLAM HCL 2 MG/2ML IJ SOLN
INTRAMUSCULAR | Status: AC
  Administered 2011-05-24: 1 mg via INTRAVENOUS
  Filled 2011-05-24: qty 2

## 2011-05-24 MED ORDER — LACTATED RINGERS IV SOLN
INTRAVENOUS | Status: DC
  Administered 2011-05-24 (×3): via INTRAVENOUS

## 2011-05-24 MED ORDER — DIPHENHYDRAMINE HCL 50 MG/ML IJ SOLN: 12.5000 mg | Freq: Four times a day (QID) | INTRAMUSCULAR | Status: DC | PRN

## 2011-05-24 MED ORDER — FENTANYL CITRATE 0.05 MG/ML IJ SOLN
INTRAMUSCULAR | Status: DC | PRN
  Administered 2011-05-24 (×2): 100 ug via INTRAVENOUS
  Administered 2011-05-24: 50 ug via INTRAVENOUS

## 2011-05-24 MED ORDER — CEFAZOLIN SODIUM 1-5 GM-% IV SOLN
1.0000 g | INTRAVENOUS | Status: AC
  Administered 2011-05-24: 1 g via INTRAVENOUS

## 2011-05-24 MED ORDER — LIDOCAINE HCL (CARDIAC) 20 MG/ML IV SOLN
INTRAVENOUS | Status: DC | PRN
  Administered 2011-05-24: 40 mg via INTRAVENOUS

## 2011-05-24 MED ORDER — DEXAMETHASONE SODIUM PHOSPHATE 10 MG/ML IJ SOLN
INTRAMUSCULAR | Status: DC | PRN
  Administered 2011-05-24: 10 mg via INTRAVENOUS

## 2011-05-24 MED ORDER — HYDROMORPHONE HCL PF 1 MG/ML IJ SOLN
INTRAMUSCULAR | Status: AC
  Filled 2011-05-24: qty 1

## 2011-05-24 MED ORDER — ONDANSETRON HCL 4 MG/2ML IJ SOLN
INTRAMUSCULAR | Status: DC | PRN
  Administered 2011-05-24: 4 mg via INTRAVENOUS

## 2011-05-24 MED ORDER — NALOXONE HCL 0.4 MG/ML IJ SOLN: 0.4000 mg | INTRAMUSCULAR | Status: DC | PRN

## 2011-05-24 MED ORDER — ROCURONIUM BROMIDE 100 MG/10ML IV SOLN
INTRAVENOUS | Status: DC | PRN
  Administered 2011-05-24: 50 mg via INTRAVENOUS

## 2011-05-24 MED ORDER — PROPOFOL 10 MG/ML IV EMUL
INTRAVENOUS | Status: DC | PRN
  Administered 2011-05-24 (×2): 20 mg via INTRAVENOUS
  Administered 2011-05-24: 150 mg via INTRAVENOUS

## 2011-05-24 MED ORDER — PROPOFOL 10 MG/ML IV EMUL
INTRAVENOUS | Status: AC
  Filled 2011-05-24: qty 20

## 2011-05-24 MED ORDER — HYDROMORPHONE HCL PF 1 MG/ML IJ SOLN
INTRAMUSCULAR | Status: DC | PRN
  Administered 2011-05-24: 1 mg via INTRAVENOUS

## 2011-05-24 MED ORDER — MIDAZOLAM HCL 5 MG/5ML IJ SOLN
INTRAMUSCULAR | Status: DC | PRN
  Administered 2011-05-24: 2 mg via INTRAVENOUS

## 2011-05-24 MED ORDER — IBUPROFEN 800 MG PO TABS
800.0000 mg | ORAL_TABLET | Freq: Three times a day (TID) | ORAL | Status: DC | PRN
  Administered 2011-05-25 – 2011-05-26 (×2): 800 mg via ORAL
  Filled 2011-05-24 (×2): qty 1

## 2011-05-24 MED ORDER — VASOPRESSIN 20 UNIT/ML IJ SOLN: INTRAVENOUS | Status: DC | PRN

## 2011-05-24 MED ORDER — BUPIVACAINE HCL (PF) 0.5 % IJ SOLN
INTRAMUSCULAR | Status: AC
  Filled 2011-05-24: qty 30

## 2011-05-24 MED ORDER — KETOROLAC TROMETHAMINE 30 MG/ML IJ SOLN: 15.0000 mg | Freq: Once | INTRAMUSCULAR | Status: DC | PRN

## 2011-05-24 MED ORDER — MIDAZOLAM HCL 2 MG/2ML IJ SOLN
0.5000 mg | Freq: Once | INTRAMUSCULAR | Status: AC | PRN
  Administered 2011-05-24: 1 mg via INTRAVENOUS

## 2011-05-24 MED ORDER — HYDROMORPHONE HCL PF 1 MG/ML IJ SOLN
INTRAMUSCULAR | Status: AC
  Administered 2011-05-24: 0.5 mg via INTRAVENOUS
  Filled 2011-05-24: qty 1

## 2011-05-24 MED ORDER — KETOROLAC TROMETHAMINE 30 MG/ML IJ SOLN
INTRAMUSCULAR | Status: DC | PRN
  Administered 2011-05-24: 30 mg via INTRAVENOUS

## 2011-05-24 MED ORDER — MEPERIDINE HCL 25 MG/ML IJ SOLN: 6.2500 mg | INTRAMUSCULAR | Status: DC | PRN

## 2011-05-24 MED ORDER — HYDROMORPHONE 0.3 MG/ML IV SOLN
INTRAVENOUS | Status: AC
  Filled 2011-05-24: qty 25

## 2011-05-24 MED ORDER — DIPHENHYDRAMINE HCL 12.5 MG/5ML PO ELIX: 12.5000 mg | ORAL_SOLUTION | Freq: Four times a day (QID) | ORAL | Status: DC | PRN

## 2011-05-24 MED ORDER — ONDANSETRON HCL 4 MG/2ML IJ SOLN
4.0000 mg | Freq: Four times a day (QID) | INTRAMUSCULAR | Status: DC | PRN
  Administered 2011-05-24: 4 mg via INTRAVENOUS
  Filled 2011-05-24: qty 2

## 2011-05-24 MED ORDER — LIDOCAINE HCL (CARDIAC) 20 MG/ML IV SOLN
INTRAVENOUS | Status: AC
  Filled 2011-05-24: qty 5

## 2011-05-24 MED ORDER — FENTANYL CITRATE 0.05 MG/ML IJ SOLN
INTRAMUSCULAR | Status: AC
  Filled 2011-05-24: qty 5

## 2011-05-24 MED ORDER — MIDAZOLAM HCL 2 MG/2ML IJ SOLN
INTRAMUSCULAR | Status: AC
  Filled 2011-05-24: qty 2

## 2011-05-24 MED ORDER — SODIUM CHLORIDE 0.9 % IJ SOLN: 9.0000 mL | INTRAMUSCULAR | Status: DC | PRN

## 2011-05-24 MED ORDER — CEFAZOLIN SODIUM 1-5 GM-% IV SOLN
INTRAVENOUS | Status: AC
  Filled 2011-05-24: qty 50

## 2011-05-24 MED ORDER — KETOROLAC TROMETHAMINE 30 MG/ML IJ SOLN
INTRAMUSCULAR | Status: AC
  Filled 2011-05-24: qty 1

## 2011-05-24 MED ORDER — FENTANYL CITRATE 0.05 MG/ML IJ SOLN
25.0000 ug | INTRAMUSCULAR | Status: DC | PRN
  Administered 2011-05-24 (×3): 50 ug via INTRAVENOUS
  Administered 2011-05-24: 100 ug via INTRAVENOUS

## 2011-05-24 MED ORDER — VASOPRESSIN 20 UNIT/ML IJ SOLN
INTRAVENOUS | Status: DC | PRN
  Administered 2011-05-24: 15:00:00 via INTRAMUSCULAR

## 2011-05-24 SURGICAL SUPPLY — 42 items
APL SKNCLS STERI-STRIP NONHPOA (GAUZE/BANDAGES/DRESSINGS) ×1
BARRIER ADHS 3X4 INTERCEED (GAUZE/BANDAGES/DRESSINGS) ×2 IMPLANT
BENZOIN TINCTURE PRP APPL 2/3 (GAUZE/BANDAGES/DRESSINGS) ×2 IMPLANT
BRR ADH 4X3 ABS CNTRL BYND (GAUZE/BANDAGES/DRESSINGS) ×1
CANISTER SUCTION 2500CC (MISCELLANEOUS) ×2 IMPLANT
CHLORAPREP W/TINT 26ML (MISCELLANEOUS) ×2 IMPLANT
CONT PATH 16OZ SNAP LID 3702 (MISCELLANEOUS) ×2 IMPLANT
DECANTER SPIKE VIAL GLASS SM (MISCELLANEOUS) ×2 IMPLANT
DRESSING TELFA 8X3 (GAUZE/BANDAGES/DRESSINGS) ×2 IMPLANT
GLOVE BIOGEL PI IND STRL 6.5 (GLOVE) ×4 IMPLANT
GLOVE BIOGEL PI INDICATOR 6.5 (GLOVE) ×4
GLOVE SURG SS PI 6.0 STRL IVOR (GLOVE) ×8 IMPLANT
GOWN PREVENTION PLUS LG XLONG (DISPOSABLE) ×4 IMPLANT
GOWN SURGICAL XLG (GOWNS) ×2 IMPLANT
NEEDLE HYPO 25X1 1.5 SAFETY (NEEDLE) ×6 IMPLANT
NS IRRIG 1000ML POUR BTL (IV SOLUTION) ×2 IMPLANT
PACK ABDOMINAL GYN (CUSTOM PROCEDURE TRAY) ×2 IMPLANT
PAD ABD 7.5X8 STRL (GAUZE/BANDAGES/DRESSINGS) ×2 IMPLANT
PAD OB MATERNITY 4.3X12.25 (PERSONAL CARE ITEMS) ×2 IMPLANT
PENCIL BUTTON HOLSTER BLD 10FT (ELECTRODE) ×2 IMPLANT
SPONGE GAUZE 4X4 12PLY (GAUZE/BANDAGES/DRESSINGS) ×2 IMPLANT
SPONGE LAP 18X18 X RAY DECT (DISPOSABLE) ×4 IMPLANT
STAPLER VISISTAT 35W (STAPLE) ×2 IMPLANT
STRIP CLOSURE SKIN 1/2X4 (GAUZE/BANDAGES/DRESSINGS) ×2 IMPLANT
SUT VIC AB 0 CT1 18XCR BRD8 (SUTURE) IMPLANT
SUT VIC AB 0 CT1 27 (SUTURE) ×4
SUT VIC AB 0 CT1 27XBRD ANBCTR (SUTURE) ×2 IMPLANT
SUT VIC AB 0 CT1 36 (SUTURE) ×8 IMPLANT
SUT VIC AB 0 CT1 8-18 (SUTURE)
SUT VIC AB 2-0 CT1 27 (SUTURE) ×2
SUT VIC AB 2-0 CT1 TAPERPNT 27 (SUTURE) ×1 IMPLANT
SUT VIC AB 3-0 SH 27 (SUTURE) ×4
SUT VIC AB 3-0 SH 27X BRD (SUTURE) ×1 IMPLANT
SUT VIC AB 3-0 SH 27XBRD (SUTURE) ×1 IMPLANT
SUT VIC AB 4-0 SH 27 (SUTURE)
SUT VIC AB 4-0 SH 27XANBCTRL (SUTURE) IMPLANT
SYR CONTROL 10ML LL (SYRINGE) ×4 IMPLANT
SYR TB 1ML 25GX5/8 (SYRINGE) ×2 IMPLANT
TAPE CLOTH SURG 4X10 WHT LF (GAUZE/BANDAGES/DRESSINGS) ×2 IMPLANT
TOWEL OR 17X24 6PK STRL BLUE (TOWEL DISPOSABLE) ×4 IMPLANT
TRAY FOLEY BAG SILVER LF 14FR (CATHETERS) ×2 IMPLANT
WATER STERILE IRR 1000ML POUR (IV SOLUTION) ×2 IMPLANT

## 2011-05-24 NOTE — Anesthesia Postprocedure Evaluation (Signed)
Anesthesia Post Note  Patient: Elaine Blake  Procedure(s) Performed: Procedure(s) (LRB): MYOMECTOMY (N/A)  Anesthesia type: General  Patient location: PACU  Post pain: Pain level controlled  Post assessment: Post-op Vital signs reviewed  Last Vitals:  Filed Vitals:   05/24/11 1600  BP: 133/95  Pulse: 83  Temp:   Resp: 23    Post vital signs: Reviewed  Level of consciousness: sedated  Complications: No apparent anesthesia complicationsfj

## 2011-05-24 NOTE — Transfer of Care (Signed)
Immediate Anesthesia Transfer of Care Note  Patient: Elaine Blake  Procedure(s) Performed: Procedure(s) (LRB): MYOMECTOMY (N/A)  Patient Location: PACU  Anesthesia Type: General  Level of Consciousness: sedated  Airway & Oxygen Therapy: Patient Spontanous Breathing and Patient connected to nasal cannula oxygen  Post-op Assessment: Report given to PACU RN and Post -op Vital signs reviewed and stable  Post vital signs: stable  Complications: No apparent anesthesia complications

## 2011-05-24 NOTE — Op Note (Signed)
Kathlynn Grate PROCEDURE DATE: 05/24/2011  PREOPERATIVE DIAGNOSIS: Symptomatic uterine leiomyomata. POSTOPERATIVE DIAGNOSIS: The same PROCEDURE: Abdominal Myomectomy SURGEON:  Dr. Catalina Antigua ASSISTANT:   INDICATIONS: 41 y.o. [id number] here for abdominal myomectomy for symptomatic uterine leiomyomata.  Risks of surgery were discussed with the patient including but not limited to: bleeding which may require transfusion or reoperation; infection which may require antibiotics; injury to bowel, bladder, ureters or other surrounding organs; need for additional procedures; thromboembolic phenomenon, incisional problems and other postoperative/anesthesia complications. Written informed consent was obtained.    FINDINGS:  Uterine leiomyomata. 2 in number, ranging in size  From 1 to 6 cm.  ANESTHESIA:    General INTRAVENOUS FLUIDS: 1500  ml ESTIMATED BLOOD LOSS:75 ml URINE OUTPUT: 250 ml SPECIMENS: Leiomyomata sent to pathology COMPLICATIONS: None immediate  PROCEDURE IN DETAIL:  The patient received IV preoperative antibiotics approximately 30 minutes prior to procedure.  She was then taken to the operating room and general anesthesia was administered without difficulty.  .She was placed in dorsal supine position and prepped and draped in a sterile manner.  A Foley catheter was inserted into the bladder and attached to Corretta Munce drainage.  After an adequate timeout was performed, attention was then turned to the abdomen where a Pfannenstiel incision was made with a scalpel.  This incision was carried down to the fascia using electrocautery.  The fascia was incised in the midline and this incision was extended bilaterally using the Mayo scissors.  Kocher's were applied to the superior aspect of the fascial incision, and the rectus muscles were dissected off bluntly and sharply using the Mayo scissors.  A similar process was carried out at the inferior aspect of the incision.  The rectus muscles were  separated in the midline bluntly and the peritoneum was identified, picked up and incised with the scalpel.  This incision was extended superiorly and inferiorly using the scalpel with good visualization of the bladder and bowel.  Attention was then turned to the uterus where multiple uterine leiomyomata were noted.  The bowel were packed away with moist laparotomy sponges and an abdominal retractor was placed.  The uterus was then delivered up through the incision using a stitch placed on the anterior surface of the uterus to help in delivering up the uterus.  Attention was then turned to the uterine surface where the largest leiomyoma was identified posteriorly. Vasopressin solution was injected over the surface of the leiomyoma to aid with hemostasis.   A vertical incision was made over the uterine leiomyoma into the leiomyoma, and the capsule was recognized.  Using blunt methods, the leiomyoma was freed from the surrounding myometrial tissue and removed intact.  The other leiomyoma was located on the fundus in proximity to the right fallopian tube and was removed in similar fashion.  After removal of all the leiomyomata which were both on the anterior and posterior aspects of the uterus, the incisions were closed in layers, initiated by using 3-0 Vicryl in figure-of-eight stitches to close the breach into the endometrial cavity.  The deeper layers were also reapproximated using 0 Vicryl running stitches, and the serosa was reapproximated using 0 Vicryl imbricating stitch.  Overall good hemostasis was noted.  The laparotomy sponges were then removed from the abdomen, and an Interceed sheet was placed over the uterus as an adhesive barrier. The abdominal retractor was then removed. Kelly clamps were placed on the peritoneum and the peritoneum was closed using 0 Vicryl running stitch.  The fascia was reapproximated with  0 Vicryl running stitch and the subcutaneous layer was reapproximated with 3-0 plain gut  interrupted sutures.  The skin was closed with 3-0 Vicryl subcuticular stitch.  The patient tolerated the procedure well. There were no complications during this case.  Sponge, lap, needle and instrument counts were correct x2.  The patient was taken to the recovery room extubated and in stable condition.

## 2011-05-24 NOTE — Anesthesia Preprocedure Evaluation (Signed)
Anesthesia Evaluation  Patient identified by MRN, date of birth, ID band Patient awake    Reviewed: Allergy & Precautions, H&P , NPO status , Patient's Chart, lab work & pertinent test results, reviewed documented beta blocker date and time   History of Anesthesia Complications Negative for: history of anesthetic complications  Airway Mallampati: II TM Distance: >3 FB Neck ROM: full    Dental  (+) Teeth Intact Prominent front teeth:   Pulmonary Current Smoker,  breath sounds clear to auscultation  Pulmonary exam normal       Cardiovascular Exercise Tolerance: Good negative cardio ROS  Rhythm:regular Rate:Normal     Neuro/Psych negative neurological ROS  negative psych ROS   GI/Hepatic negative GI ROS, Neg liver ROS,   Endo/Other  negative endocrine ROS  Renal/GU negative Renal ROS  Female GU complaint     Musculoskeletal   Abdominal   Peds  Hematology negative hematology ROS (+)   Anesthesia Other Findings   Reproductive/Obstetrics negative OB ROS                           Anesthesia Physical Anesthesia Plan  ASA: II  Anesthesia Plan: General ETT   Post-op Pain Management:    Induction:   Airway Management Planned:   Additional Equipment:   Intra-op Plan:   Post-operative Plan:   Informed Consent: I have reviewed the patients History and Physical, chart, labs and discussed the procedure including the risks, benefits and alternatives for the proposed anesthesia with the patient or authorized representative who has indicated his/her understanding and acceptance.   Dental Advisory Given  Plan Discussed with: CRNA and Surgeon  Anesthesia Plan Comments:         Anesthesia Quick Evaluation

## 2011-05-25 LAB — CBC
Platelets: 239 10*3/uL (ref 150–400)
RBC: 3.16 MIL/uL — ABNORMAL LOW (ref 3.87–5.11)
RDW: 14.7 % (ref 11.5–15.5)
WBC: 14.6 10*3/uL — ABNORMAL HIGH (ref 4.0–10.5)

## 2011-05-25 MED ORDER — OXYCODONE-ACETAMINOPHEN 5-325 MG PO TABS
1.0000 | ORAL_TABLET | ORAL | Status: DC | PRN
  Administered 2011-05-25: 2 via ORAL
  Administered 2011-05-25 (×2): 1 via ORAL
  Administered 2011-05-25: 2 via ORAL
  Administered 2011-05-26 (×2): 1 via ORAL
  Filled 2011-05-25 (×4): qty 1
  Filled 2011-05-25 (×2): qty 2

## 2011-05-25 NOTE — Anesthesia Postprocedure Evaluation (Signed)
  Anesthesia Post-op Note  Patient: Elaine Blake  Procedure(s) Performed: Procedure(s) (LRB): MYOMECTOMY (N/A)  Patient Location: PAC[facility]  Anesthesia Type: General  Level of Consciousness: awake, alert  and oriented  Airway and Oxygen Therapy: Patient Spontanous Breathing  Post-op Pain: mild  Post-op Assessment: Post-op Vital signs reviewed  Post-op Vital Signs: Reviewed and stable  Complications: No apparent anesthesia complications

## 2011-05-25 NOTE — Progress Notes (Signed)
UR Chart review completed.  

## 2011-05-25 NOTE — Addendum Note (Signed)
Addendum  created 05/25/11 0817 by Maruice Pieroni M Omir Cooprider, RN   Modules edited:Notes Section    

## 2011-05-25 NOTE — Progress Notes (Signed)
1 Day Post-Op Procedure(s) (LRB): MYOMECTOMY (N/A)  Subjective: Patient reports doing well. Denies chest pain, SOB, lightheadedness or dizziness, nausea or emesis. Tolerating regular diet. Out of bed and ambulating. Pain well controlled with pain medications    Objective: I have reviewed patient's vital signs, intake and output and medications.  General: alert, cooperative and no distress Resp: clear to auscultation bilaterally Cardio: regular rate and rhythm GI: normal findings: bowel sounds normal and soft, appropriately tender and incision: clean, dry and no erythema, induration or drainage. Steri-strips in place Extremities: Homans sign is negative, no sign of DVT and no edema, redness or tenderness in the calves or thighs  Assessment: s/p Procedure(s) (LRB): MYOMECTOMY (N/A): stable and tolerating diet  Plan: Encourage ambulation Discharge planning for tomorrow  LOS: 1 day    Shonn Farruggia 05/25/2011, 10:37 AM

## 2011-05-26 MED ORDER — OXYCODONE-ACETAMINOPHEN 5-325 MG PO TABS: 1.0000 | ORAL_TABLET | ORAL | Status: AC | PRN

## 2011-05-26 NOTE — Progress Notes (Signed)
Discharge instructions reviewed with patient.  Patient states understanding of self home care.  No home equipment needed.  Ambulated to car with staff without incident.  Discharged home with family.

## 2011-05-26 NOTE — Discharge Instructions (Signed)
Myomectomy Myoma is a non-cancerous tumor made up of fibrous tissue. It is also called leiomyoma, but more often called a fibroid tumor. Myomectomy is the removal of a fibroid tumor without removing another organ, like the uterus or ovary, with it. Fibroids range from the size of a pea to a grapefruit. They are rarely cancerous. Myomas only need treatment when they are growing or when they cause symptoms, such aspain, pressure, bleeding, and pain with intercourse. LET YOUR CAREGIVER KNOW ABOUT:  Any allergies, especially to medicines.   If you develop a cold or an infection before your surgery.   Medicines taken, including vitamins, herbs, eyedrops, over-ther-counter medicines, and creams.   Use of steroids (by mouth or creams).   Previous problems with numbing medicines.   History of blood clots or other bleeding problems.   Other health problems, such as diabetes, kidney, heart, or lung problems.   Previous surgery.   Possibility of pregnancy, if this applies.  RISKS AND COMPLICATIONS   Excessive bleeding.   Infection.   Injury to other organs.   Blood clots in the legs, chest, and brain.   Scar tissue (adhesions) on other organs and in the pelvis.   Death during or after the surgery.  BEFORE THE PROCEDURE  Follow your caregiver's advice regarding your surgery and preparing for surgery.   Avoid taking aspirin or blood thinners as directed by your caregiver.   DO NOT eat or drink anything after midnight on the night before surgery, or as directed by your caregiver.   DO NOT smoke (if you smoke) for 2 weeks before the surgery.   DO NOT drink alcohol the day before the surgery.   If you are admitted the day of the surgery,arrive1 hour before your surgery is scheduled.   Arrange to have someone take you home from the hospital.   Arrange to have someone care for you when you go home.  PROCEDURE There are several ways to perform a myomectomy:  Hysteroscopy  myomectomy. A lighted tube is inserted inside the uterus. The tube will remove the fibroid. This is used when the fibroid is inside the cavity of the uterus.   Laparoscopic myomectomy. A long, lighted tube is inserted through 2 or 3 small incisions to see the organs in the pelvis. The fibroid is removed.   Myomectomy through a sugical cut (incicion) in the abdomen. The fibroid is removed through an incision made in the stomach. This way is performed when thethe fibroid cannot be removed with a hysteroscope or laprascope.  AFTER THE PROCEDURE  If you had laparoscopic or hysteroscopic myomectomy, you may go home the same day or stay overnight.   If you had abdominal myomectomy, you may stay in the hospital a few days.   Your intravenous (IV)access tube and catheter will be removed in 1 or 2 days.   If you stay in the hospital, your caregiver will order pain medicine and a sleeping pill, if needed.   You may be placed on an antibiotic medicine, if needed.   You may be given written instructions and medicines before you are sent home.  Document Released: 11/19/2006 Document Revised: 01/11/2011 Document Reviewed: 12/01/2008 [employer] Patient Information 2012 [employer]. 

## 2011-05-26 NOTE — Discharge Summary (Signed)
Physician Discharge Summary  Patient ID: Elaine Blake MRN: [id number] DOB/AGE: 07-04-1970 41 y.o.  Admit date: 05/24/2011 Discharge date: 05/26/2011  Admission Diagnoses: fibroid  Discharge Diagnoses: same  Hospital Course: She underwent an uncomplicated myomectomy. Her pre op Hbg was 10.4, post op 8.4. She was ready to go home on POD #2.  Discharge Exam: Blood pressure 130/80, pulse 92, temperature 98.3 F (36.8 C), temperature source Oral, resp. rate 18, height 5\' 2"  (1.575 m), weight 73.483 kg (162 lb), SpO2 99.00%. General appearance: alert Resp: clear to auscultation bilaterally Breasts: normal appearance, no masses or tenderness GI: normal findings: bowel sounds normal  Disposition: 01-Home or Self Care   Medication List  As of 05/26/2011  2:30 PM   TAKE these medications         ibuprofen 800 MG tablet   Commonly known as: ADVIL,MOTRIN   Take 800 mg by mouth every 6 (six) hours as needed. For pain      oxyCODONE-acetaminophen 5-325 MG per tablet   Commonly known as: PERCOCET   Take 1-2 tablets by mouth every 4 (four) hours as needed.             Signed: Asa Baudoin C. 05/26/2011, 2:30 PM

## 2011-05-26 NOTE — Progress Notes (Signed)
POD #2  S. No problems, ambulating, tolerating po well, voiding, having flatus.  O. VSS, AF      Heart- rrr      Lungs- CTAB      Abd- NABS, ND, NT      Incision- C/D/I  A/P. POD #2 doing well      Discharge home      Follow up 6 weeks

## 2011-05-28 ENCOUNTER — Encounter: Payer: Self-pay | Admitting: *Deleted

## 2011-05-28 ENCOUNTER — Encounter (HOSPITAL_COMMUNITY): Payer: Self-pay | Admitting: Obstetrics and Gynecology

## 2011-07-06 ENCOUNTER — Ambulatory Visit (INDEPENDENT_AMBULATORY_CARE_PROVIDER_SITE_OTHER): Payer: Self-pay | Admitting: Obstetrics and Gynecology

## 2011-07-06 VITALS — BP 133/94 | HR 105 | Temp 99.4°F | Ht 62.0 in | Wt 164.2 lb

## 2011-07-06 DIAGNOSIS — Z09 Encounter for follow-up examination after completed treatment for conditions other than malignant neoplasm: Secondary | ICD-10-CM

## 2011-07-06 NOTE — Progress Notes (Signed)
41 yo G5P1041 with LMP 06/08/2011 and BMI 30 presenting today for post operative visit. Patient underwent an abdominal myomectomy on 4/18. Patient has been doing well since discharge from the hospital, denies fever, chills, drainage from incision or persistent abdominal pain.  GENERAL: Well-developed, well-nourished female in no acute distress.  HEENT: Normocephalic, atraumatic. Sclerae anicteric.  NECK: Supple. Normal thyroid.  LUNGS: Clear to auscultation bilaterally.  HEART: Regular rate and rhythm. ABDOMEN: Soft, nontender, nondistended. No organomegaly. Incision: healed  PELVIC: Normal external female genitalia. Vagina is pink and rugated.  Normal discharge. Normal appearing cervix. Uterus is normal in size.  No adnexal mass or tenderness. EXTREMITIES: No cyanosis, clubbing, or edema, 2+ distal pulses.  A/P 41 yo G5P1041 s/p abdominal myomectomy on 05/24/2011 here for post-op check - pathology results reviewed with patient - Patient planning on trying to conceive in January 2014.  - Patient advised to have a cesarean section due to extensive uterine dissection at time of myomectomy - Recommended to start prenatal vitamins - RTC in January for annual exam - Patient medically cleared to resume all activities of daily living

## 2011-09-24 ENCOUNTER — Emergency Department (HOSPITAL_COMMUNITY)
Admission: EM | Admit: 2011-09-24 | Discharge: 2011-09-24 | Disposition: A | Discharge status: Home / Self Care | Payer: Self-pay | Attending: Emergency Medicine | Admitting: Emergency Medicine

## 2011-09-24 ENCOUNTER — Encounter (HOSPITAL_COMMUNITY): Payer: Self-pay | Admitting: Emergency Medicine

## 2011-09-24 DIAGNOSIS — F17200 Nicotine dependence, unspecified, uncomplicated: Secondary | ICD-10-CM | POA: Insufficient documentation

## 2011-09-24 DIAGNOSIS — Z9109 Other allergy status, other than to drugs and biological substances: Secondary | ICD-10-CM | POA: Insufficient documentation

## 2011-09-24 DIAGNOSIS — M62838 Other muscle spasm: Secondary | ICD-10-CM | POA: Insufficient documentation

## 2011-09-24 DIAGNOSIS — Z91040 Latex allergy status: Secondary | ICD-10-CM | POA: Insufficient documentation

## 2011-09-24 DIAGNOSIS — Z8249 Family history of ischemic heart disease and other diseases of the circulatory system: Secondary | ICD-10-CM | POA: Insufficient documentation

## 2011-09-24 DIAGNOSIS — Z8489 Family history of other specified conditions: Secondary | ICD-10-CM | POA: Insufficient documentation

## 2011-09-24 MED ORDER — DIAZEPAM 5 MG PO TABS: 5.0000 mg | ORAL_TABLET | Freq: Three times a day (TID) | ORAL | Status: AC | PRN

## 2011-09-24 MED ORDER — IBUPROFEN 800 MG PO TABS: 800.0000 mg | ORAL_TABLET | Freq: Three times a day (TID) | ORAL | Status: AC | PRN

## 2011-09-24 NOTE — ED Notes (Signed)
Pt c/o left shoulder and neck pain x 1 month; pt denies obvious injury

## 2011-09-24 NOTE — ED Notes (Signed)
C/o left side neck & shoulder pain x 3 weeks. Reports 1 month ago awoke with "a crook in my neck" that resolved after 1 week but then pain to shoulder & neck appeared. C/o increased pain with mvmt, turning neck, use of LUE. Denies injury

## 2011-09-24 NOTE — ED Provider Notes (Signed)
History   This chart was scribed for Elaine Blake B. Bernette Mayers, MD by Melba Coon. The patient was seen in room TR09C/TR09C and the patient's care was started at 6:41PM.    CSN: [id number]  Arrival date & time 09/24/11  1755   First MD Initiated Contact with Patient 09/24/11 1839      Chief Complaint  Patient presents with  . Shoulder Pain    (Consider location/radiation/quality/duration/timing/severity/associated sxs/prior treatment) HPI Elaine Blake is a 41 y.o. female who presents to the Emergency Department complaining of constant, moderate to severe left shoulder pain with an onset 3 weeks ago. Pt states that it radiates to the neck and has been getting progressively worse. Oxycodone and applied heat slightly alleviated the pain. No HA, fever, neck pain, sore throat, rash, back pain, CP, SOB, abd pain, n/v/d, dysuria, or extremity edema, weakness, numbness, or tingling. Hx of similar pain in the past; pt states that she slept on the shoulder frequently; pain eventually went away on its own; no medical help received. No other pertinent medical symptoms.  Past Medical History  Diagnosis Date  . Gonorrhea   . Trichomonas   . Fibroid, uterine   . Ovarian cyst   . Anemia     h/o anemia in past    Past Surgical History  Procedure Date  . Dilation and curettage of uterus   . Myomectomy 05/24/2011    Procedure: MYOMECTOMY;  Surgeon: Catalina Antigua, MD;  Location: [facility];  Service: Gynecology;  Laterality: N/A;    Family History  Problem Relation Age of Onset  . Anesthesia problems Neg Hx   . Hypertension Father   . Seizures Son     History  Substance Use Topics  . Smoking status: Current Everyday Smoker -- 1.0 packs/day    Types: Cigarettes  . Smokeless tobacco: Never Used  . Alcohol Use: No    OB History    Grav Para Term Preterm Abortions TAB SAB Ect Mult Living   5 1 1  0 4 2 2  0 0 1      Review of Systems 10 Systems reviewed and all are negative for acute  change except as noted in the HPI.   Allergies  Latex and Other  Home Medications   Current Outpatient Rx  Name Route Sig Dispense Refill  . IBUPROFEN 800 MG PO TABS Oral Take 800 mg by mouth every 6 (six) hours as needed. For pain    . OXYCODONE-ACETAMINOPHEN 5-325 MG PO TABS Oral Take 1-2 tablets by mouth every 4 (four) hours as needed. For pain      BP 115/90  Pulse 91  Temp 97.6 F (36.4 C) (Oral)  Resp 16  SpO2 97%  Physical Exam  Constitutional: She is oriented to person, place, and time. She appears well-developed and well-nourished.  HENT:  Head: Normocephalic and atraumatic.  Neck: Neck supple.  Pulmonary/Chest: Effort normal.  Musculoskeletal: She exhibits tenderness (left shoulder musclar tenderness with no bony tenderness or defomity).  Neurological: She is alert and oriented to person, place, and time. No cranial nerve deficit.  Psychiatric: She has a normal mood and affect. Her behavior is normal.    ED Course  Procedures (including critical care time)  DIAGNOSTIC STUDIES: Oxygen Saturation is 97% on room air, normal by my interpretation.    COORDINATION OF CARE:   6:45PM - Pt is advised to take anti-inflammatory meds at home and continue applying heat to the area as well as massage. Pt will be Rx  flexeril. Pt ready for d/c.  Labs Reviewed - No data to display No results found.   No diagnosis found.    MDM  Shoulder muscle spasm. I personally performed the services described in the documentation, which were scribed in my presence. The recorded information has been reviewed and considered.          Nancee Brownrigg B. Bernette Mayers, MD 09/26/11 1610

## 2012-08-10 IMAGING — US US TRANSVAGINAL NON-OB
1 series · 13 of 25 positions shown · non-contrast
Comparison: 09/02/2009

CLINICAL DATA: Pelvic pain and cramping.  Endometriosis.  LMP
01/04/2011.



[Series 1: us pelvis complete · 92 acquisitions, 13 frames shown]
[im 1/92]
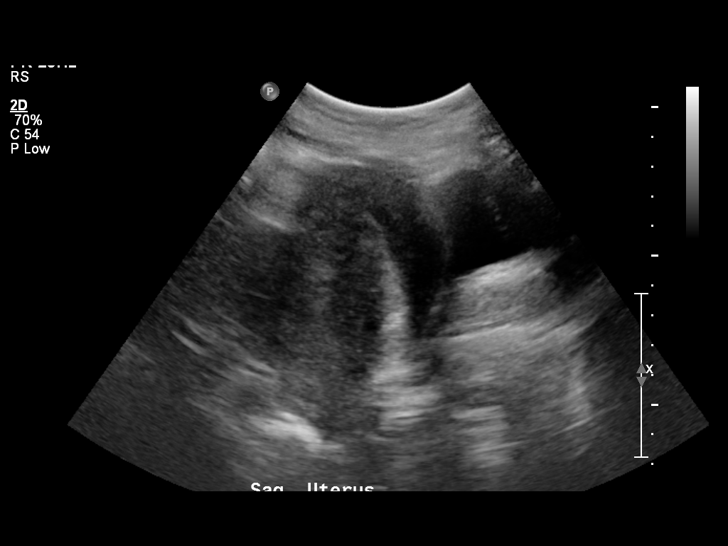
[im 8/92]
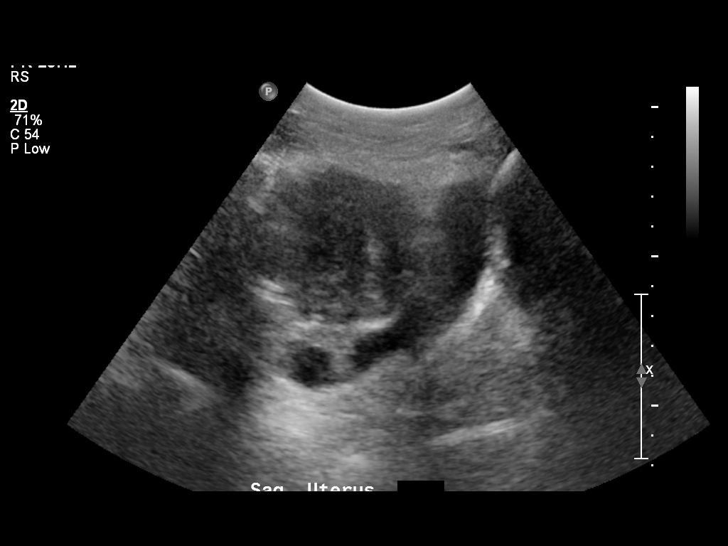
[im 16/92]
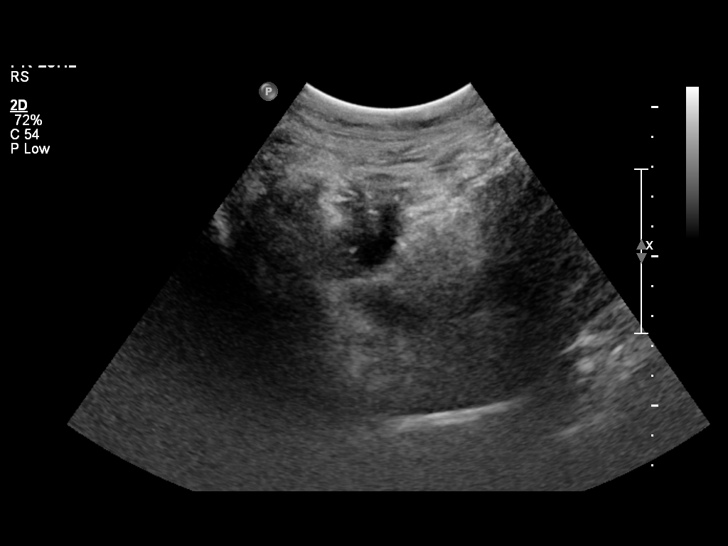
[im 23/92]
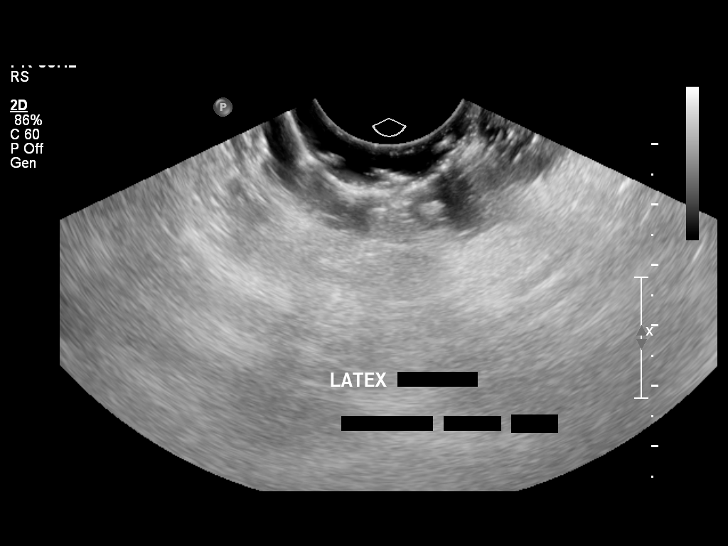
[im 31/92]
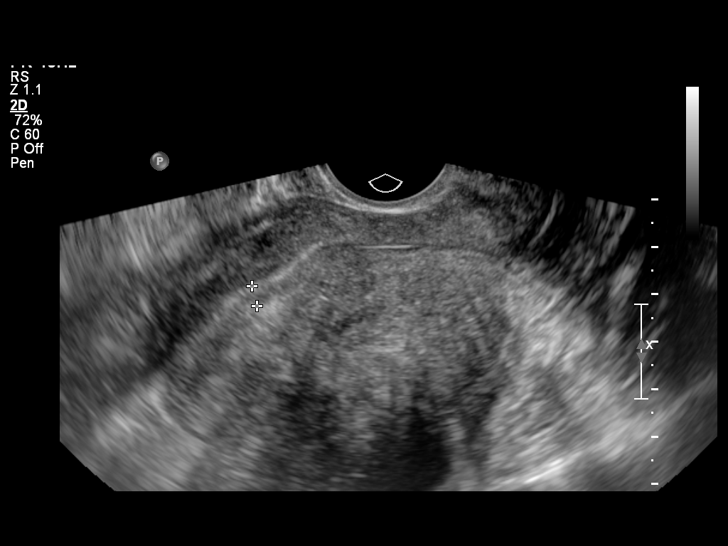
[im 38/92]
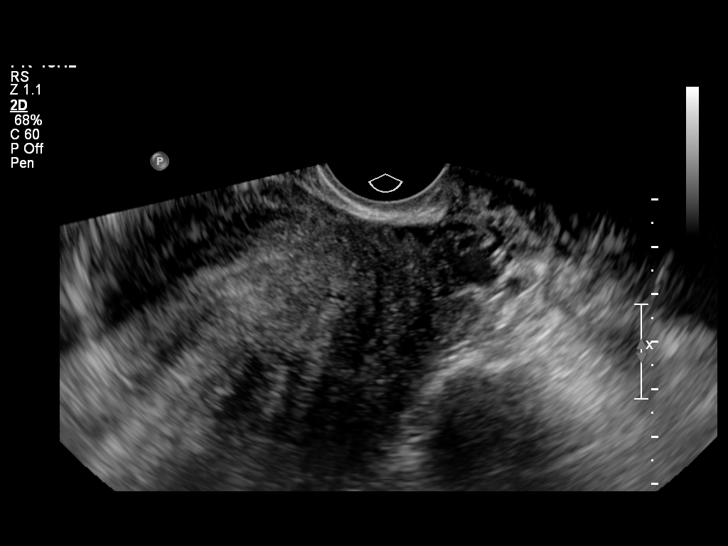
[im 46/92]
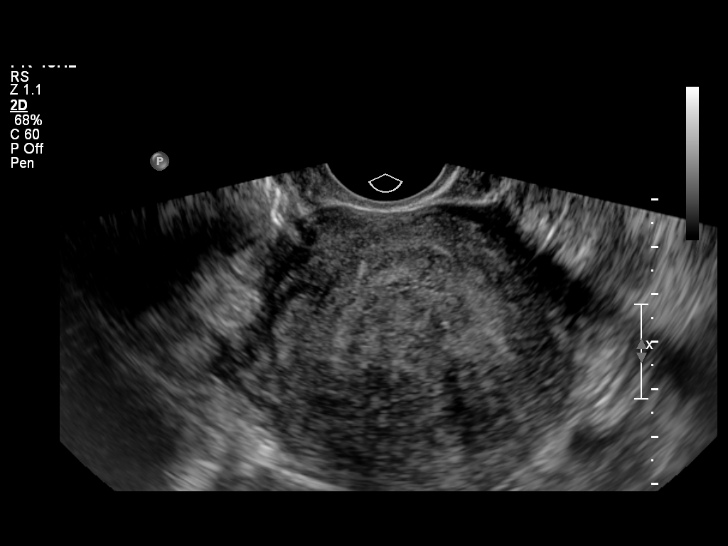
[im 54/92]
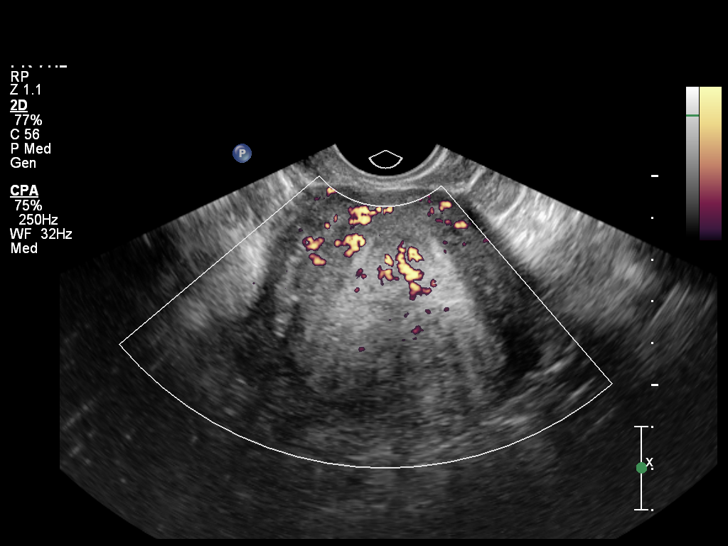
[im 61/92]
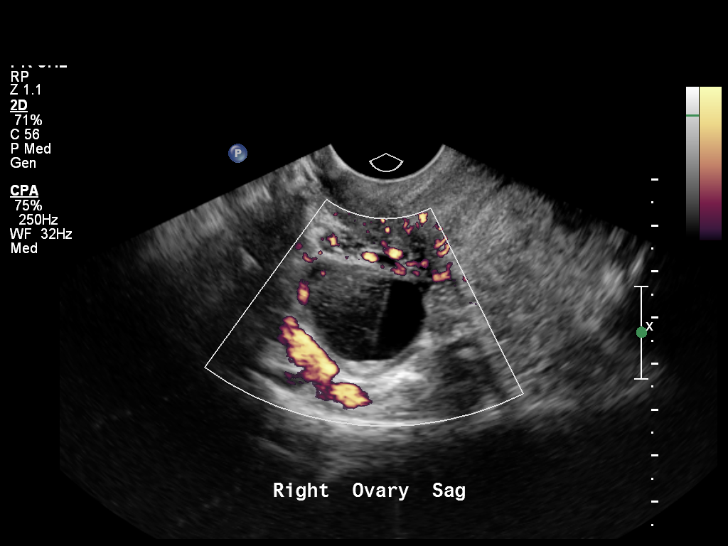
[im 69/92]
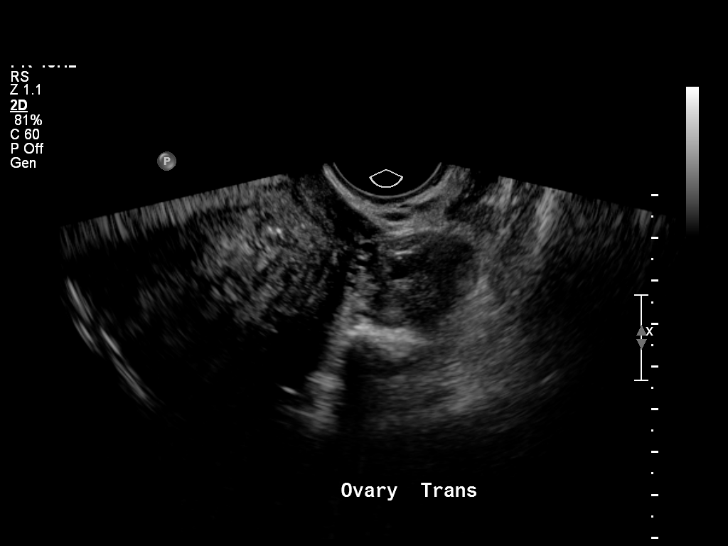
[im 76/92]
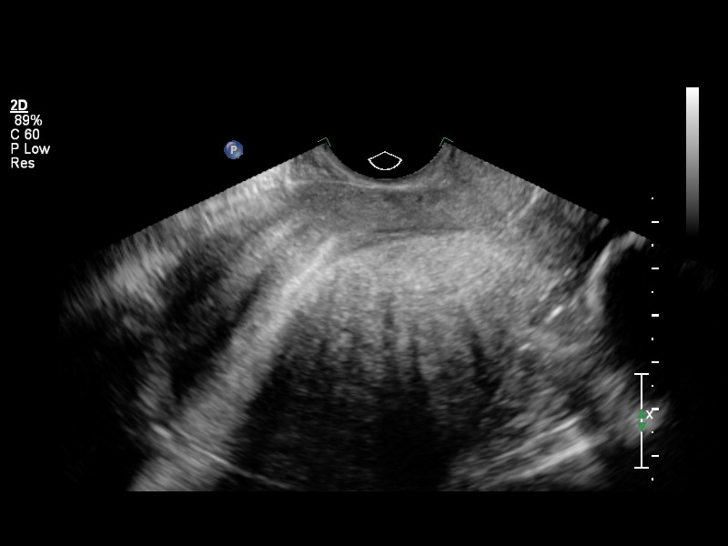
[im 84/92]
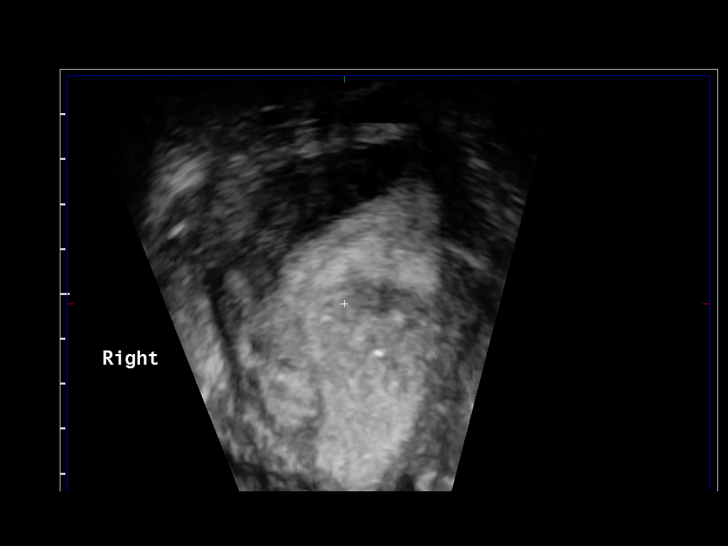
[im 92/92]
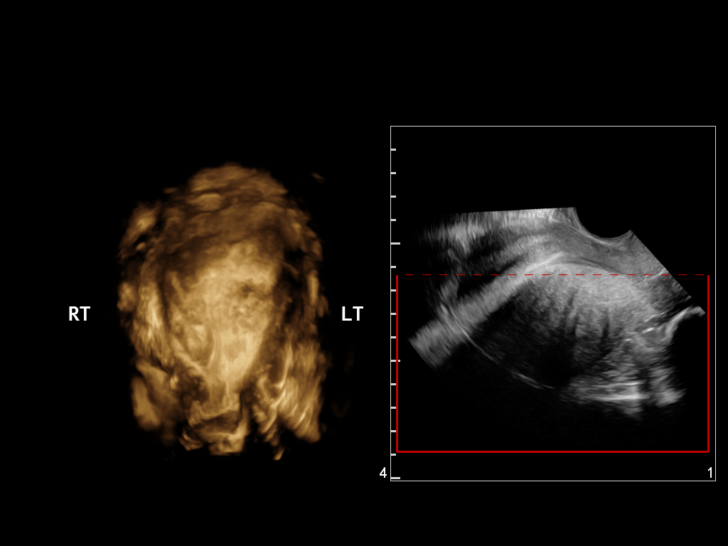

[13 of 25 positions shown; findings below may reference images not displayed]

FINDINGS: Uterus:  9.7 x 6.5 x 6.8 cm. A heterogeneous myometrial mass is
seen in the posterior uterine body which measures approximately
x 6.2 by 5.8 cm.  This shows mild increase in size since previous
study, and mayrepresent a fibroid or adenomyoma.

Endometrium: Double layer thickness measures 8 mm transvaginally.

Right ovary: 3.0 x 1.9 x 2.9 cm.  A benign hemorrhagic cyst
containing a hematocrit level is seen which measures 2.0 x 2.5 x
2.6 cm.

Left ovary: 3.8 x 2.2 x 2.6 cm.  Normal appearance.

Other Findings:  No free fluid
IMPRESSION: 1. Mild enlargement of 6 cm posterior uterine fibroid versus
adenomyoma.  Pelvic MRI could be performed for further
characterization if clinically warranted.
2.  2.6 cm right ovarian hemorrhagic cyst.
3.  Normal appearance of the left ovary.

## 2012-09-14 ENCOUNTER — Emergency Department (HOSPITAL_COMMUNITY): Payer: Self-pay

## 2012-09-14 ENCOUNTER — Encounter (HOSPITAL_COMMUNITY): Payer: Self-pay | Admitting: Emergency Medicine

## 2012-09-14 ENCOUNTER — Emergency Department (HOSPITAL_COMMUNITY)
Admission: EM | Admit: 2012-09-14 | Discharge: 2012-09-14 | Disposition: A | Discharge status: Home / Self Care | Payer: Self-pay | Attending: Emergency Medicine | Admitting: Emergency Medicine

## 2012-09-14 DIAGNOSIS — R51 Headache: Secondary | ICD-10-CM | POA: Insufficient documentation

## 2012-09-14 DIAGNOSIS — F17200 Nicotine dependence, unspecified, uncomplicated: Secondary | ICD-10-CM | POA: Insufficient documentation

## 2012-09-14 DIAGNOSIS — R42 Dizziness and giddiness: Secondary | ICD-10-CM | POA: Insufficient documentation

## 2012-09-14 DIAGNOSIS — R0789 Other chest pain: Secondary | ICD-10-CM

## 2012-09-14 DIAGNOSIS — Z8742 Personal history of other diseases of the female genital tract: Secondary | ICD-10-CM | POA: Insufficient documentation

## 2012-09-14 DIAGNOSIS — Z91040 Latex allergy status: Secondary | ICD-10-CM | POA: Insufficient documentation

## 2012-09-14 DIAGNOSIS — Z8619 Personal history of other infectious and parasitic diseases: Secondary | ICD-10-CM | POA: Insufficient documentation

## 2012-09-14 DIAGNOSIS — R071 Chest pain on breathing: Secondary | ICD-10-CM | POA: Insufficient documentation

## 2012-09-14 DIAGNOSIS — Z862 Personal history of diseases of the blood and blood-forming organs and certain disorders involving the immune mechanism: Secondary | ICD-10-CM | POA: Insufficient documentation

## 2012-09-14 LAB — POCT I-STAT TROPONIN I: Troponin i, poc: 0.02 ng/mL (ref 0.00–0.08)

## 2012-09-14 LAB — CBC
Hemoglobin: 11.4 g/dL — ABNORMAL LOW (ref 12.0–15.0)
MCH: 28.1 pg (ref 26.0–34.0)
MCV: 84.9 fL (ref 78.0–100.0)
Platelets: 265 10*3/uL (ref 150–400)
RBC: 4.05 MIL/uL (ref 3.87–5.11)
WBC: 5.9 10*3/uL (ref 4.0–10.5)

## 2012-09-14 LAB — BASIC METABOLIC PANEL
CO2: 24 mEq/L (ref 19–32)
Calcium: 8.8 mg/dL (ref 8.4–10.5)
Chloride: 104 mEq/L (ref 96–112)
Creatinine, Ser: 0.73 mg/dL (ref 0.50–1.10)
Glucose, Bld: 85 mg/dL (ref 70–99)
Sodium: 137 mEq/L (ref 135–145)

## 2012-09-14 MED ORDER — MECLIZINE HCL 12.5 MG PO TABS: 12.5000 mg | ORAL_TABLET | Freq: Three times a day (TID) | ORAL | Status: DC | PRN

## 2012-09-14 MED ORDER — MECLIZINE HCL 25 MG PO TABS
25.0000 mg | ORAL_TABLET | Freq: Once | ORAL | Status: AC
  Administered 2012-09-14: 25 mg via ORAL
  Filled 2012-09-14: qty 1

## 2012-09-14 MED ORDER — IBUPROFEN 800 MG PO TABS
800.0000 mg | ORAL_TABLET | Freq: Once | ORAL | Status: AC
  Administered 2012-09-14: 800 mg via ORAL
  Filled 2012-09-14: qty 1

## 2012-09-14 NOTE — ED Provider Notes (Signed)
CSN: [id number]     Arrival date & time 09/14/12  1705 History     First MD Initiated Contact with Patient 09/14/12 1837     Chief Complaint  Patient presents with  . Chest Pain  . Headache   (Consider location/radiation/quality/duration/timing/severity/associated sxs/prior Treatment) HPI Comments: 42 year old female presents to the emergency department complaining of lightheadedness and dizziness over the past 2 months, worsening today. Describes the lightheadedness and dizziness as an "funny feeling", worse when she stands up, relieved by laying flat. Despite triage summary, patient denies headache. She was laying flat on the bed today around 2:00 PM and developed left-sided chest pain, nonradiating, described as a pressure feeling, worse with movements. She has not tried any alleviating factors for her pain, lightheadedness or dizziness. She has never had symptoms like this in the past. Denies shortness of breath, nausea, vomiting, diaphoresis, visual disturbance. States she's been eating well and staying well hydrated.  Patient is a 42 y.o. female presenting with chest pain and headaches. The history is provided by the patient.  Chest Pain Associated symptoms: dizziness   Associated symptoms: no abdominal pain, no back pain, no cough, no diaphoresis, no fever, no headache, no nausea, no shortness of breath and not vomiting   Headache Associated symptoms: dizziness   Associated symptoms: no abdominal pain, no back pain, no cough, no fever, no nausea and no vomiting     Past Medical History  Diagnosis Date  . Gonorrhea   . Trichomonas   . Fibroid, uterine   . Ovarian cyst   . Anemia     h/o anemia in past   Past Surgical History  Procedure Laterality Date  . Dilation and curettage of uterus    . Myomectomy  05/24/2011    Procedure: MYOMECTOMY;  Surgeon: Catalina Antigua, MD;  Location: [facility];  Service: Gynecology;  Laterality: N/A;   Family History  Problem Relation Age of  Onset  . Anesthesia problems Neg Hx   . Hypertension Father   . Seizures Son    History  Substance Use Topics  . Smoking status: Current Every Day Smoker -- 1.00 packs/day for 23 years    Types: Cigarettes  . Smokeless tobacco: Never Used  . Alcohol Use: No   OB History   Grav Para Term Preterm Abortions TAB SAB Ect Mult Living   5 1 1  0 4 2 2  0 0 1     Review of Systems  Constitutional: Negative for fever, chills, diaphoresis, activity change and appetite change.  Respiratory: Negative for cough and shortness of breath.   Cardiovascular: Positive for chest pain. Negative for leg swelling.  Gastrointestinal: Negative for nausea, vomiting and abdominal pain.  Musculoskeletal: Negative for back pain.  Neurological: Positive for dizziness and light-headedness. Negative for headaches.  Psychiatric/Behavioral: Negative for confusion.  All other systems reviewed and are negative.    Allergies  Latex and Other  Home Medications   Current Outpatient Rx  Name  Route  Sig  Dispense  Refill  . ibuprofen (ADVIL,MOTRIN) 200 MG tablet   Oral   Take 200 mg by mouth every 6 (six) hours as needed for pain.          BP 140/81  Pulse 67  Temp(Src) 98.7 F (37.1 C) (Oral)  Resp 20  SpO2 100%  LMP 08/29/2012 Physical Exam  Nursing note and vitals reviewed. Constitutional: She is oriented to person, place, and time. She appears well-developed and well-nourished. No distress.  HENT:  Head: Normocephalic  and atraumatic.  Mouth/Throat: Oropharynx is clear and moist.  Eyes: Conjunctivae and EOM are normal. Pupils are equal, round, and reactive to light.  Neck: Normal range of motion. Neck supple.  Cardiovascular: Normal rate, regular rhythm, normal heart sounds and intact distal pulses.   No extremity edema.  Pulmonary/Chest: Effort normal and breath sounds normal. She has no decreased breath sounds. She has no wheezes. She has no rhonchi. She has no rales. She exhibits tenderness.      Abdominal: Soft. Bowel sounds are normal. There is no tenderness.  Musculoskeletal: Normal range of motion. She exhibits no edema.  Neurological: She is alert and oriented to person, place, and time. She has normal strength. No cranial nerve deficit or sensory deficit. She displays a negative Romberg sign. Coordination and gait normal.  Skin: Skin is warm and dry. She is not diaphoretic.  Psychiatric: She has a normal mood and affect. Her behavior is normal.    ED Course   Procedures (including critical care time)  Labs Reviewed  CBC - Abnormal; Notable for the following:    Hemoglobin 11.4 (*)    HCT 34.4 (*)    All other components within normal limits  BASIC METABOLIC PANEL  POCT I-STAT TROPONIN I   Dg Chest 2 View  09/14/2012   *RADIOLOGY REPORT*  Clinical Data: Chest pain  CHEST - 2 VIEW  Comparison: None.  Findings: Mild cardiomegaly.  Clear lungs.  No pneumothorax.  No pleural fluid.  No acute bony deformity.  IMPRESSION: No active cardiopulmonary disease.   Original Report Authenticated By: Jolaine Click, M.D.    Date: 09/14/2012  Rate: 68  Rhythm: normal sinus rhythm  QRS Axis: normal  Intervals: normal  ST/T Wave abnormalities: normal  Conduction Disutrbances:none  Narrative Interpretation: no stemi  Old EKG Reviewed: none available   1. Vertigo   2. Dizziness   3. Chest wall pain     MDM  Patient with reproducible chest pain along with symptoms of vertigo. Dizziness worsened upon standing. She is in no apparent distress. EKG unremarkable. Labs obtained at triage prior to patient being seen, troponin negative, CBC and bmp. Labs at baseline. Normal vital signs. She appears in no apparent distress. Will give meclizine for dizziness and ibuprofen for chest pain. Chest pain most likely musculoskeletal, doubt cardiac or pulmonary. Chest x-ray unremarkable. 8:09 PM Patient reports improvement of dizziness after receiving meclizine. Chest pain is beginning to subside  with ibuprofen. She'll be discharged with a prescription for meclozine. Advised followup with her PCP. Close return cautions discussed. Patient states understanding of plan and is agreeable. Patient also evaluated by Dr. Jodi Mourning who agrees with plan of care.  Trevor Mace, PA-C 09/14/12 2009

## 2012-09-14 NOTE — ED Notes (Signed)
Pt states she has had ongoing dizziness and lightheadness today, however she states she has had it a few times in the past. She also  C/o pain in her chest onset today at 2pm. Denies n/v, back pain, shoulder or arm pain on either side, or diaphoresis, or  blurred vision.

## 2012-09-14 NOTE — ED Notes (Signed)
Pt reports chest pain starting today around 2pm. Pt states that the pain is in the left side of her chest without any radiation. Pt describes the pain as a constant pressure on her chest. Pt also is c/o dizziness, headache and lightheadedness. Pt denies shob, nausea, vomiting, diaphoresis, and pain radiation. Pt is A/O.

## 2012-09-14 NOTE — ED Notes (Signed)
Pt reports intermittent chest pain x2 weeks.today 6/10, headache as well.

## 2012-09-15 NOTE — ED Provider Notes (Signed)
Medical screening examination/treatment/procedure(s) were conducted as a shared visit with non-physician practitioner(s) or resident and myself. I personally evaluated the patient during the encounter and agree with the findings and plan unless otherwise indicated.  Vertigo and lightheaded. Chest pain atypical, 2 wks, reproduceable. EKG reviewed, no acute findings. Cardiac work up unremarkable. Pt sxs resolved in ED. Likely peripheral vertigo, worse with standing/ movement. Normal neuro exam. Fup discussed.  Labs Reviewed   CBC - Abnormal; Notable for the following:    Hemoglobin  11.4 (*)     HCT  34.4 (*)     All other components within normal limits   BASIC METABOLIC PANEL   POCT I-STAT TROPONIN I      Enid Skeens, MD 09/15/12 1858

## 2013-09-10 ENCOUNTER — Other Ambulatory Visit: Payer: Self-pay | Admitting: Obstetrics and Gynecology

## 2013-09-10 DIAGNOSIS — Z1231 Encounter for screening mammogram for malignant neoplasm of breast: Secondary | ICD-10-CM

## 2013-10-06 ENCOUNTER — Ambulatory Visit (HOSPITAL_COMMUNITY)
Admission: RE | Admit: 2013-10-06 | Discharge: 2013-10-06 | Disposition: A | Discharge status: Home / Self Care | Payer: Self-pay | Source: Ambulatory Visit | Attending: Obstetrics and Gynecology | Admitting: Obstetrics and Gynecology

## 2013-10-06 ENCOUNTER — Encounter (HOSPITAL_COMMUNITY): Payer: Self-pay

## 2013-10-06 VITALS — BP 132/80 | Temp 98.2°F | Ht 64.0 in | Wt 189.8 lb

## 2013-10-06 DIAGNOSIS — Z1231 Encounter for screening mammogram for malignant neoplasm of breast: Secondary | ICD-10-CM

## 2013-10-06 DIAGNOSIS — Z1239 Encounter for other screening for malignant neoplasm of breast: Secondary | ICD-10-CM

## 2013-10-06 NOTE — Patient Instructions (Signed)
Explained to Elaine Blake that she did not need a Pap smear today due to last Pap smear was 03/01/2011. Let her know [facility] will cover Pap smears every 3 years unless has a history of abnormal Pap smears. Let patient know the [facility] will follow up with her within the next couple weeks with results by letter or phone. Elaine Blake verbalized understanding. Patient escorted to mammography for a screening mammogram.  [place]ann[place], Arvil Chaco, RN 11:30 AM

## 2013-10-06 NOTE — Progress Notes (Signed)
No complaints today.  Pap Smear:  Pap smear not completed today. Last Pap smear was 03/01/2011 at the [facility] and normal. Per patient has no history of an abnormal Pap smear. Last Pap smear result is in EPIC.  Physical exam: Breasts Breasts symmetrical. No skin abnormalities bilateral breasts. No nipple retraction bilateral breasts. No nipple discharge bilateral breasts. No lymphadenopathy. No lumps palpated bilateral breasts. No complaints of pain or tenderness on exam. Patient escorted to mammography for a screening mammogram.        Pelvic/Bimanual No Pap smear completed today since last Pap smear was 03/01/2011. Pap smear not indicated per [facility] guidelines.

## 2013-10-29 ENCOUNTER — Telehealth: Payer: Self-pay

## 2013-10-29 NOTE — Telephone Encounter (Signed)
Called to remind of 8 am appointment at [facility] and that needed to be fasting.

## 2013-10-30 ENCOUNTER — Other Ambulatory Visit: Payer: Self-pay

## 2013-10-30 ENCOUNTER — Ambulatory Visit (HOSPITAL_BASED_OUTPATIENT_CLINIC_OR_DEPARTMENT_OTHER): Payer: Self-pay

## 2013-10-30 VITALS — BP 120/88 | HR 78 | Temp 99.0°F | Resp 14 | Ht 62.0 in | Wt 189.0 lb

## 2013-10-30 DIAGNOSIS — N946 Dysmenorrhea, unspecified: Secondary | ICD-10-CM

## 2013-10-30 DIAGNOSIS — Z0000 Encounter for general adult medical examination without abnormal findings: Secondary | ICD-10-CM

## 2013-10-30 LAB — GLUCOSE (CC13): GLUCOSE: 103 mg/dL (ref 70–140)

## 2013-10-30 LAB — LIPID PANEL
Cholesterol: 221 mg/dL — ABNORMAL HIGH (ref 0–200)
HDL: 50 mg/dL (ref >39)
LDL CALC: 152 mg/dL — AB (ref 0–99)
TRIGLYCERIDES: 94 mg/dL (ref <150)
Total CHOL/HDL Ratio: 4.4 Ratio
VLDL: 19 mg/dL (ref 0–40)

## 2013-10-30 LAB — HEMOGLOBIN A1C
Hgb A1c MFr Bld: 5.2 % (ref <5.7)
Mean Plasma Glucose: 103 mg/dL (ref <117)

## 2013-10-30 NOTE — Patient Instructions (Signed)
Discussed health assessment with patient. Talked with patient about smoking cessation and gave resources. Will decide on programs she wants when call lab results. Let patient know that will call her on Wednesday with lab results and make appointment at [facility] if needed. Informed not to let Health and wellness do any labs or procedures until goes to eligibility and get orange card or patient assistance. Patient verbalized understanding.

## 2013-10-30 NOTE — Progress Notes (Signed)
Patient is a new patient to the [facility] program and is currently a [facility] patient effective 10/06/2013.   Clinical Measurements: Patient is 5 ft. 2 inches, weight 189 lbs, waist circumference 36 inches, and hip circumference 48.5 inches.   Medical History: Patient has no history of high cholesterol. Patient does not have a history of hypertension or diabetes. Per patient no diagnosed history of coronary heart disease, heart attack, heart failure, stroke/TIA, vascular disease or congenital heart defects.   Blood Pressure, Self-measurement: Patient states has no reason to check Blood pressure.  Nutrition Assessment: Patient stated that eats 2 fruits every day. Patient states she eats  2 servings of vegetables a day. Per patient does not eat 3 or more ounces of whole grains daily. Patient doesn't eat two or more servings of fish weekly. Patient states she does drink more than 36 ounces or 450 calories of beverages with added sugars weekly. Patient likes to drink at least two glasses of sweetened lemonade a day.Patient states she drinks a lot of water. Patient stated she does  watch her salt intake.  Physical Activity Assessment: Patient stated she does not do any kind of exercise. Per patient only cleans house every two weeks. Patient stated that she use to walk.  Smoking Status: Patient states that smokes about a pack a day. Discussed options for quitting smoking. Patients states that she is around smoke 4 hours a day.  Quality of Life Assessment: In assessing patient's quality of life she stated that out of the past 30 days that she has felt her health is good all of them. Patient also stated that in the past 30 days that her mental health was good including stress, depression and problems with emotions for 30 days. Patient did state that out of the past 30 days she felt her physical or mental health had not kept her from doing her usual activities including self-care, work or recreation.   Plan:  Lab work will be done today including a lipid panel, blood glucose, and Hgb A1C. Will decide if needs and wants Health Coaching, lifestyle or community  Program when call lab results.

## 2013-11-02 ENCOUNTER — Telehealth: Payer: Self-pay

## 2013-11-02 NOTE — Telephone Encounter (Signed)
Called to give results from [facility] program lab test. Asked to call back or will call patient again on Wednesday.

## 2013-11-04 ENCOUNTER — Telehealth: Payer: Self-pay

## 2013-11-04 NOTE — Telephone Encounter (Signed)
Called to inform about lab work from 10/30/13. Informed patient: cholesterol- 221, HDL- 50, LDL- 152, triglycerides - 94, Bld Glucose -103 and HBG-A1C - 5.2.  Set up health Coaching for October 6 at 9 AM at cancer center for [place].

## 2013-11-09 ENCOUNTER — Telehealth: Payer: Self-pay

## 2013-11-09 NOTE — Telephone Encounter (Signed)
Called to remind of health coaching appointment.

## 2013-11-10 ENCOUNTER — Ambulatory Visit: Payer: Self-pay

## 2013-11-10 DIAGNOSIS — Z789 Other specified health status: Secondary | ICD-10-CM

## 2013-11-10 NOTE — Patient Instructions (Signed)
Patient will follow 1600 calorie diet plan. Will review all handouts, [employer] program book, and exercise/activity book. Will increase exercise and use pedometer. Will measure portion sizes. Will call if has any questions. Will call patient in three weeks. Patient verbalized understanding.

## 2013-11-10 NOTE — Progress Notes (Signed)
Patient returns today for Health Coaching regarding Nutrition for her Cholesterol level, her LDL, and BMI-34.6 and activity,   NUTRITION: Patient decided that A [employer] Program would work best for her. Reviewed assessments and [employer] book with patient. Discussed increasing fiber in diet, reading labels and serving sizes. Went over with patient servings that could have for 1,600 calorie menu.Patient received and reviewed the following handouts:  My Plate, [employer] Program book, [employer] Leaf Cookbook,Understanding you cholesterol numbers, and how to increase fiber handouts. Gave patient measuring cup to measure serving sizes and demonstrated the serving sizes.  .ACTIVITY: Discussed walking to increase steps to 7,500/day. Patient stated that she was ready to get startedPatient received pedometer and was explained and shown how it works. Received booklet called Exercise and Physical Activity [employer] book to work on balance and flexibility.  PLAN: Patient will increase amount of walking per day and add other exercises to plan,  Lose weight. Follow up times three per phone unless needs and can call.

## 2014-01-25 ENCOUNTER — Telehealth: Payer: Self-pay

## 2014-01-25 NOTE — Telephone Encounter (Signed)
Called to follow up on how progressing with [employer] Plan, activity and weight loss. Left message for patient to return call.

## 2014-04-15 IMAGING — CR DG CHEST 2V
2 series · 2 of 2 positions shown · non-contrast
Comparison: None.

CLINICAL DATA: Chest pain

CHEST - 2 VIEW

[w chest pa]
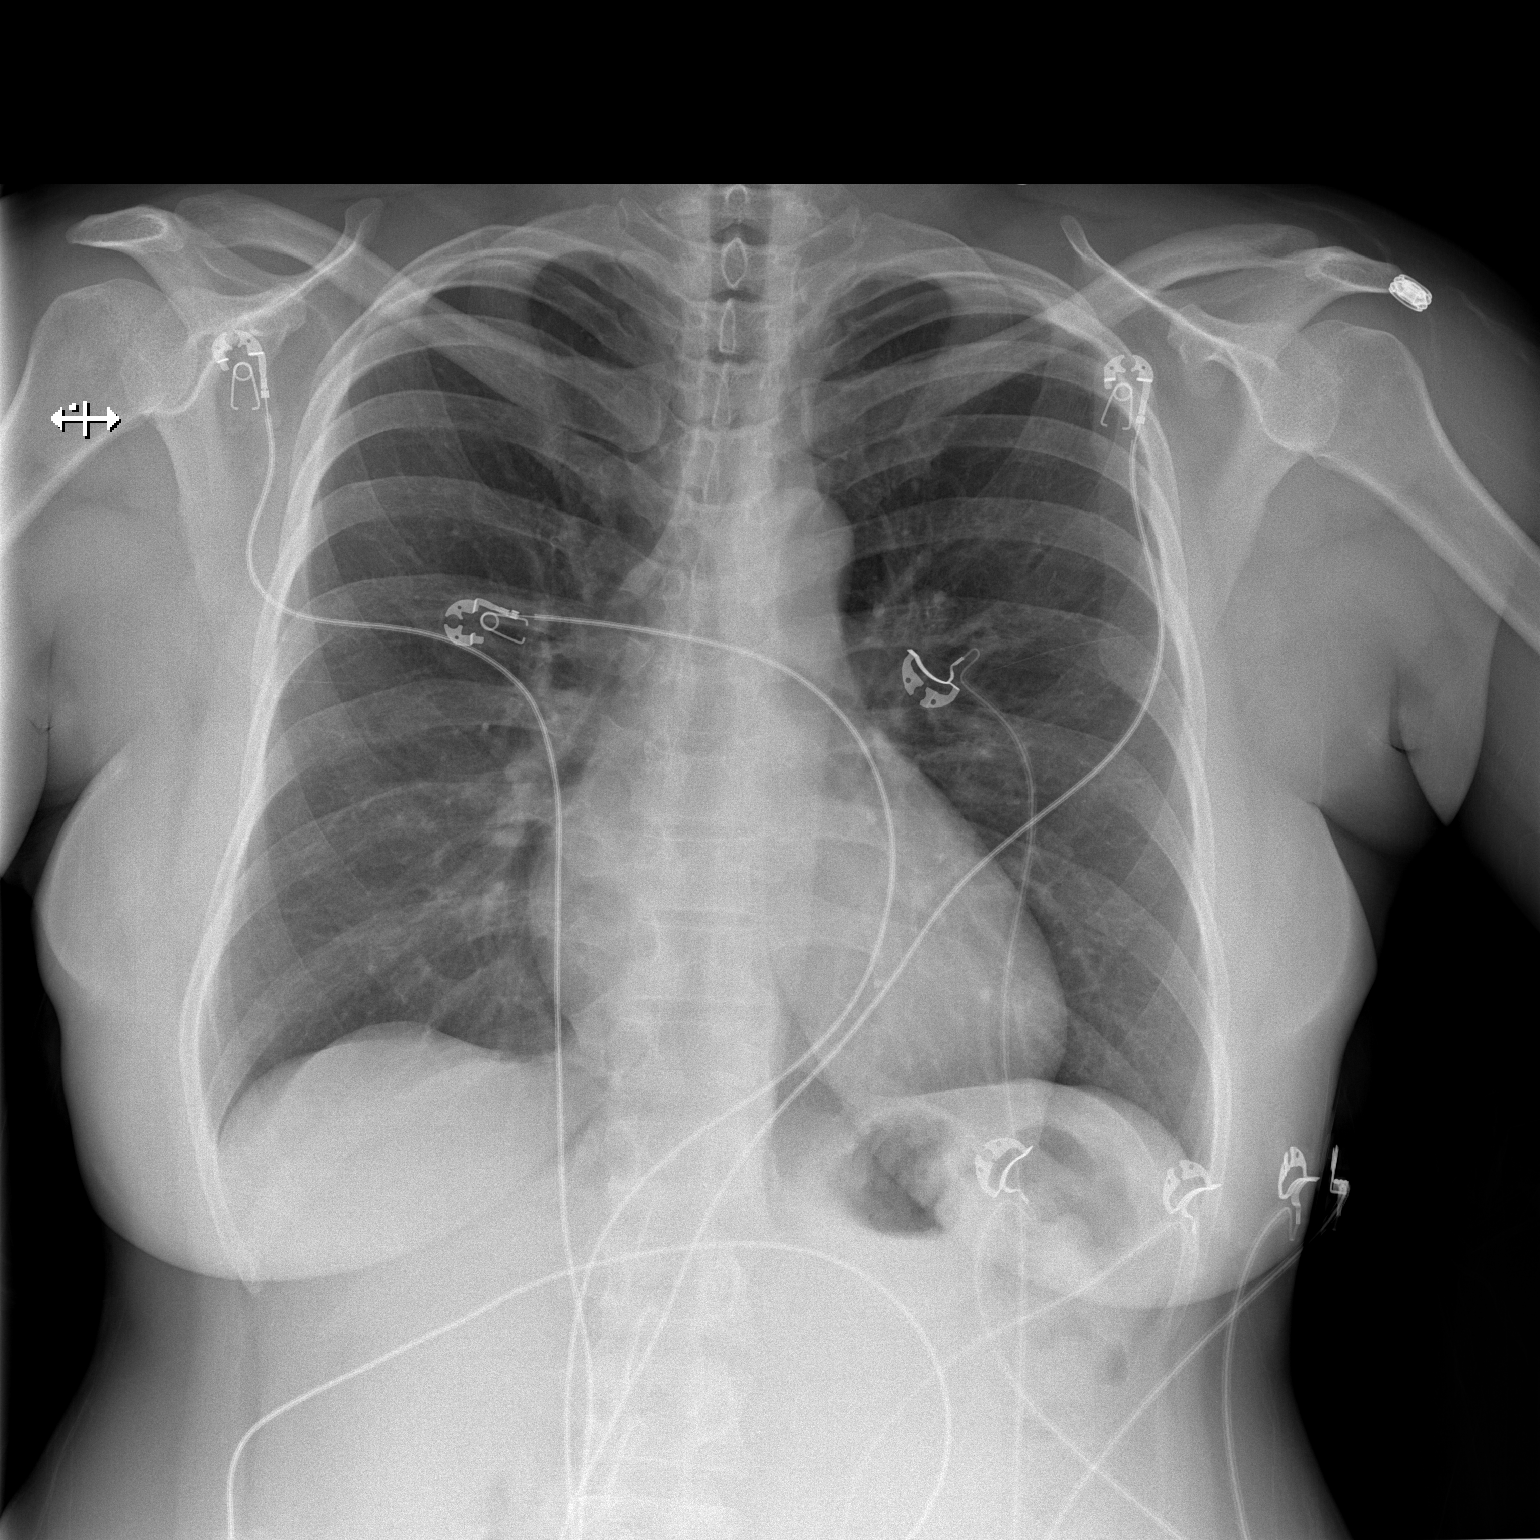

[w chest lat]
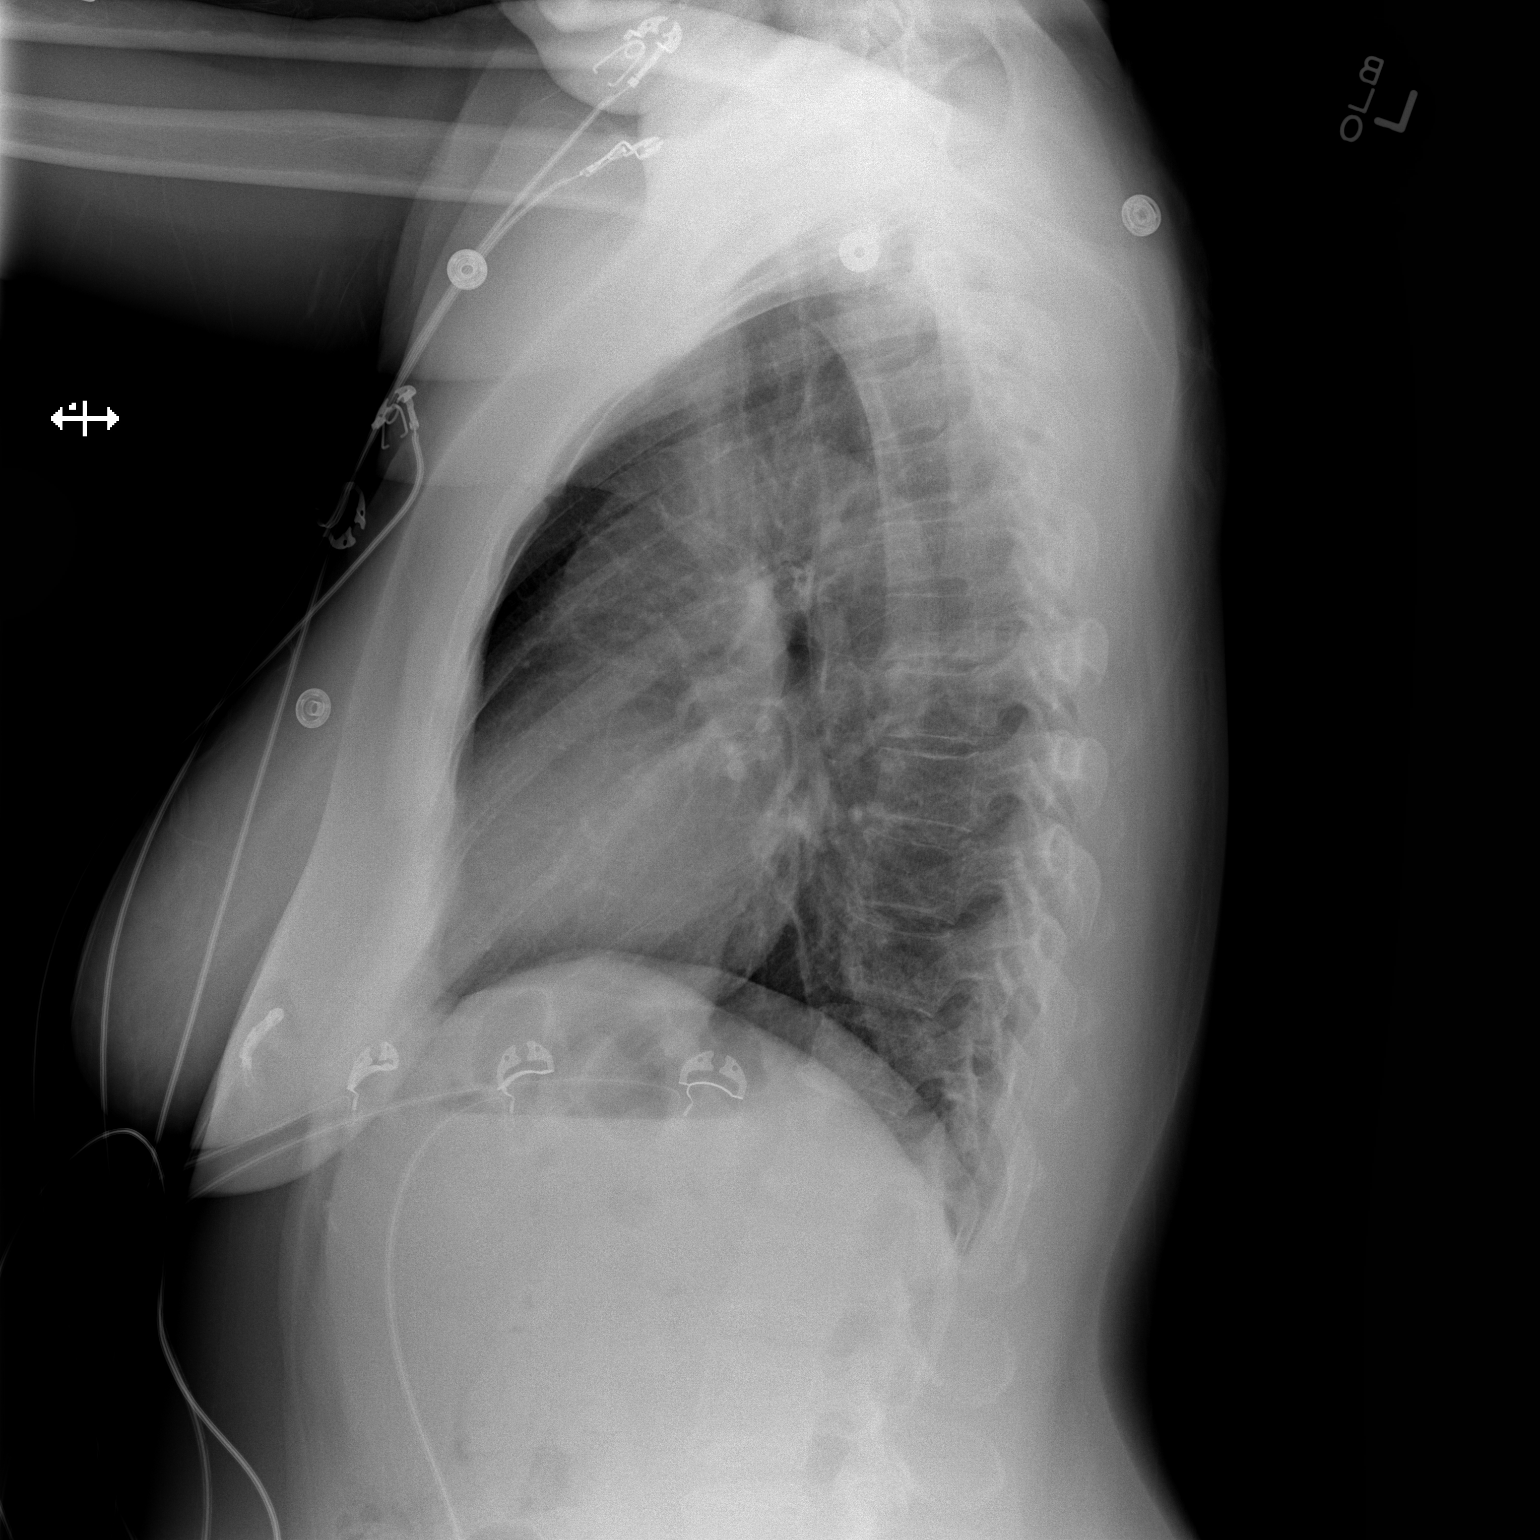

[2 of 2 positions shown; findings below may reference images not displayed]

FINDINGS: Mild cardiomegaly.  Clear lungs.  No pneumothorax.  No
pleural fluid.  No acute bony deformity.
IMPRESSION: No active cardiopulmonary disease.

## 2016-07-17 ENCOUNTER — Encounter (HOSPITAL_COMMUNITY): Payer: Self-pay | Admitting: *Deleted

## 2019-09-15 ENCOUNTER — Other Ambulatory Visit: Payer: Self-pay

## 2019-09-15 DIAGNOSIS — Z20822 Contact with and (suspected) exposure to covid-19: Secondary | ICD-10-CM

## 2019-09-17 LAB — SARS-COV-2, NAA 2 DAY TAT

## 2019-09-17 LAB — NOVEL CORONAVIRUS, NAA: SARS-CoV-2, NAA: NOT DETECTED

## 2019-12-12 ENCOUNTER — Other Ambulatory Visit: Payer: Self-pay

## 2019-12-15 ENCOUNTER — Other Ambulatory Visit: Payer: Self-pay

## 2019-12-15 DIAGNOSIS — Z20822 Contact with and (suspected) exposure to covid-19: Secondary | ICD-10-CM

## 2019-12-16 LAB — SARS-COV-2, NAA 2 DAY TAT

## 2019-12-16 LAB — NOVEL CORONAVIRUS, NAA: SARS-CoV-2, NAA: NOT DETECTED

## 2023-03-25 ENCOUNTER — Encounter (HOSPITAL_BASED_OUTPATIENT_CLINIC_OR_DEPARTMENT_OTHER): Payer: Self-pay | Admitting: Certified Nurse Midwife

## 2023-04-30 ENCOUNTER — Encounter (HOSPITAL_BASED_OUTPATIENT_CLINIC_OR_DEPARTMENT_OTHER): Payer: Self-pay | Admitting: *Deleted

## 2023-05-09 ENCOUNTER — Ambulatory Visit (HOSPITAL_BASED_OUTPATIENT_CLINIC_OR_DEPARTMENT_OTHER): Payer: Self-pay | Admitting: Family Medicine

## 2023-06-20 ENCOUNTER — Ambulatory Visit (INDEPENDENT_AMBULATORY_CARE_PROVIDER_SITE_OTHER): Payer: Self-pay | Admitting: Family Medicine

## 2023-06-20 ENCOUNTER — Other Ambulatory Visit (HOSPITAL_COMMUNITY)
Admission: RE | Admit: 2023-06-20 | Discharge: 2023-06-20 | Disposition: A | Discharge status: Home / Self Care | Payer: Self-pay | Source: Ambulatory Visit | Attending: Family Medicine | Admitting: Family Medicine

## 2023-06-20 ENCOUNTER — Encounter (HOSPITAL_BASED_OUTPATIENT_CLINIC_OR_DEPARTMENT_OTHER): Payer: Self-pay | Admitting: Family Medicine

## 2023-06-20 VITALS — BP 160/90 | HR 62 | Ht 62.0 in | Wt 172.8 lb

## 2023-06-20 DIAGNOSIS — N898 Other specified noninflammatory disorders of vagina: Secondary | ICD-10-CM

## 2023-06-20 DIAGNOSIS — Z124 Encounter for screening for malignant neoplasm of cervix: Secondary | ICD-10-CM

## 2023-06-20 DIAGNOSIS — Z1211 Encounter for screening for malignant neoplasm of colon: Secondary | ICD-10-CM

## 2023-06-20 DIAGNOSIS — L0231 Cutaneous abscess of buttock: Secondary | ICD-10-CM | POA: Insufficient documentation

## 2023-06-20 DIAGNOSIS — Z1231 Encounter for screening mammogram for malignant neoplasm of breast: Secondary | ICD-10-CM

## 2023-06-20 DIAGNOSIS — F5101 Primary insomnia: Secondary | ICD-10-CM | POA: Insufficient documentation

## 2023-06-20 DIAGNOSIS — R030 Elevated blood-pressure reading, without diagnosis of hypertension: Secondary | ICD-10-CM | POA: Insufficient documentation

## 2023-06-20 DIAGNOSIS — Z122 Encounter for screening for malignant neoplasm of respiratory organs: Secondary | ICD-10-CM

## 2023-06-20 DIAGNOSIS — Z23 Encounter for immunization: Secondary | ICD-10-CM

## 2023-06-20 MED ORDER — MUPIROCIN 2 % EX OINT: TOPICAL_OINTMENT | CUTANEOUS | 3 refills | Status: AC

## 2023-06-20 MED ORDER — CEPHALEXIN 500 MG PO CAPS: 500.0000 mg | ORAL_CAPSULE | Freq: Four times a day (QID) | ORAL | 0 refills | Status: AC

## 2023-06-20 NOTE — Patient Instructions (Addendum)
 Sleep 3 - extended release melatonin     Benzoyl peroxide creamy wash for body for abscess formation

## 2023-06-20 NOTE — Progress Notes (Signed)
 New Patient Office Visit  Subjective:   Elaine Blake 52-Jul-1972 06/20/2023  Chief Complaint  Patient presents with   New Patient (Initial Visit)    Patient is here today to get established with the practice. Has a boil on her bottom that she states has started to drain. Also has problems with sleeping and states she constantly has vaginal discharge.    HPI: SOPHIE CHEVRIER presents today to establish care at [facility] at [facility]. Introduced to Publishing rights manager role and practice setting.  All questions answered.   Last PCP: None Last annual physical:  Several Years  Concerns: See below   ABSCESS: Onset: Reports chronic abscess/boil formation every 2-3 months for past 3 years due sweating. She reports several decades ago she had trauma from a deep freezer injury to that area with a cyst formation and went to ER for I&D.  Location: Buttocks , Left  Pain:  yes Quality:  Patient states the area will start to drain when showering  Treatments attempted:Antibiotics several years ago  Past MRSA skin infections:  no History of trauma in area:  no  Fever: no Redness:  no Swelling:  yes Warmth:  yes Drainage:  yes   VAGINAL DISCHARGE: Onset: Chronic    Description: She reports chronic vaginal discharge ongoing for several years. She states she is unable to use panty liners due to amount of discharge and sweating. Reports discharge is clear.   Symptoms Odor:  No Itching: no  Dysuria: no  Bleeding: no  Pelvic pain: no  Back pain: no  Fever: no  Genital sores: no  Rash: no  Dyspareunia: no  Prior treatment: no   Red Flags: Missed period: no  Pregnancy: no  Recent antibiotics: no  Sexual activity: yes  Possible STD exposure: no, declines testing   IUD: no  Diabetes: no    INSOMNIA: TABRIA MUA presents for the medical management of Insomnia. Patient states she has difficulty with falling asleep. She has  not trouble staying asleep. She reports snoring and grinding her teeth at night.  Duration: years Average hours of sleep: 8 hours; patient states she is on 3rd shift for work.  Difficulty falling asleep: yes Difficulty staying asleep: no Good sleep hygiene: no; reports watching shows on laptop, will fall asleep with headphones on frequently.  Treatments tried: Advil  PM , Melatonin    Recent stress: no Daytime sleepiness: no Apnea: no Snoring: no History of sleep study: no   The following portions of the patient's history were reviewed and updated as appropriate: past medical history, past surgical history, family history, social history, allergies, medications, and problem list.   Patient Active Problem List   Diagnosis Date Noted   Gluteal abscess 06/20/2023   Primary insomnia 06/20/2023   Elevated BP without diagnosis of hypertension 06/20/2023   Dysmenorrhea 03/01/2011   Fibroid uterus 03/01/2011   Menorrhagia 03/01/2011   Past Medical History:  Diagnosis Date   Abdominal pain 10/30/2006   Qualifier: Diagnosis of   By: Katheleen Palmer MD, [place]      Replacing diagnoses that were inactivated after the 05/07/22 regulatory import     Anemia    h/o anemia in past   Fibroid, uterine    Gonorrhea    Ovarian cyst    Trichomonas    Past Surgical History:  Procedure Laterality Date   DILATION AND CURETTAGE OF UTERUS     MYOMECTOMY  05/24/2011   Procedure: MYOMECTOMY;  Surgeon: Verlyn Goad, MD;  Location: [facility];  Service: Gynecology;  Laterality: N/A;   Family History  Problem Relation Age of Onset   Dementia Mother    Multiple sclerosis Mother    Hypertension Father    Diabetes Father    COPD Father    Seizures Son    Breast cancer Maternal Aunt    Anesthesia problems Neg Hx    Social History   Socioeconomic History   Marital status: Single    Spouse name: Not on file   Number of children: Not on file   Years of education: Not on file   Highest education level: Not  on file  Occupational History   Not on file  Tobacco Use   Smoking status: Every Day    Current packs/day: 1.00    Average packs/day: 1 pack/day for 23.0 years (23.0 ttl pk-yrs)    Types: Cigarettes   Smokeless tobacco: Never  Substance and Sexual Activity   Alcohol use: No   Drug use: No   Sexual activity: Not Currently    Birth control/protection: None    Comment: Last intercourse in October  Other Topics Concern   Not on file  Social History Narrative   ** Merged History Encounter **       Social Drivers of Corporate investment banker Strain: Not on file  Food Insecurity: Not on file  Transportation Needs: Not on file  Physical Activity: Not on file  Stress: Not on file  Social Connections: Not on file  Intimate Partner Violence: Not on file   Outpatient Medications Prior to Visit  Medication Sig Dispense Refill   ibuprofen  (ADVIL ,MOTRIN ) 200 MG tablet Take 200 mg by mouth every 6 (six) hours as needed for pain.     meclizine  (ANTIVERT ) 12.5 MG tablet Take 1 tablet (12.5 mg total) by mouth 3 (three) times daily as needed. 30 tablet 0   No facility-administered medications prior to visit.   Allergies  Allergen Reactions   Latex     Fever blisters on mouth   Other     Alfredo sauce     ROS: A complete ROS was performed with pertinent positives/negatives noted in the HPI. The remainder of the ROS are negative.   Objective:   Today's Vitals   06/20/23 1039 06/20/23 1143  BP: (!) 185/97 (!) 160/90  Pulse: 62   SpO2: 100%   Weight: 172 lb 12.8 oz (78.4 kg)   Height: 5\' 2"  (1.575 m)     GENERAL: Well-appearing, in NAD. Well nourished.  SKIN: Pink, warm and dry. No rash, lesion, ulceration, or ecchymoses.  Head: Normocephalic. NECK: Trachea midline. Full ROM w/o pain or tenderness.  RESPIRATORY: Chest wall symmetrical. Respirations even and non-labored. Breath sounds clear to auscultation bilaterally.  CARDIAC: S1, S2 present, regular rate and rhythm without  murmur or gallops. Peripheral pulses 2+ bilaterally.  GU: External genitalia without erythema, lesions, or masses. No lymphadenopathy. Vaginal mucosa pink and moist with white thick discharge, no lesions, and no ulcerations. Cervix pink without discharge. Cervical os closed. Uterus and adnexae palpable, not enlarged, and w/o tenderness. No palpable masses. Chaperoned by Fredia Janus, CMA RECTAL: Small 0.75cm abscess present to left medial buttocks with white serous drainage. Mild yellow, pink granulation tissue present. No induration with palpation. Chaperoned by : Fredia Janus, CMA MSK: Muscle tone and strength appropriate for age. Joints w/o tenderness, redness, or swelling.  EXTREMITIES: Without clubbing, cyanosis, or edema.  NEUROLOGIC: No motor or sensory  deficits. Steady, even gait. C2-C12 intact.  PSYCH/MENTAL STATUS: Alert, oriented x 3. Cooperative, appropriate mood and affect.    Health Maintenance Due  Topic Date Due   Pneumococcal Vaccine 40-67 Years old (2 of 2 - PCV) 05/24/2012   Cervical Cancer Screening (HPV/Pap Cotest)  02/28/2014   Colonoscopy  Never done   Lung Cancer Screening  Never done   MAMMOGRAM  10/29/2021   Zoster Vaccines- Shingrix (1 of 2) Never done    No results found for any visits on 06/20/23.     Assessment & Plan:  1. Gluteal abscess (Primary) Discussed concern for possible cyst presence needing I&D due to recurrence and previous trauma.  No induration on exam and scant drainage.  Will treat with Keflex 4 times daily x 7 days and topical mupirocin 3 times daily x 7 days.  If worsening or no resolution, recommend patient reach out to PCP and will consider dermatology or general surgery consult.  2. Breast cancer screening by mammogram - MM 3D SCREENING MAMMOGRAM BILATERAL BREAST; Future  3. Screening for colon cancer Referral placed for colonoscopy - Ambulatory referral to Gastroenterology  4. Immunization due VIS reviewed with patient.  Recommend  she obtain her Shingrix vaccines.  Tdap provided today. - Tdap vaccine greater than or equal to 7yo IM  5. Encounter for screening for lung cancer Discussed risks and benefits of screening and patient verbalized understanding.  She will be called to schedule. - CT CHEST LUNG CA SCREEN LOW DOSE W/O CM; Future  6. Vaginal discharge Discussed hygiene such as cotton underwear, avoidance of fragrance, soaps, and use of a probiotic.  Will test for BV and yeast with vaginal swab today.  Patient declined STI testing. - Cervicovaginal ancillary only  7. Encounter for Papanicolaou smear for cervical cancer screening Pap performed.  Will be notified of results when available. - Cytology - PAP  8. Primary insomnia Discussed good sleep hygiene such as cool dark room, avoidance of technology 30 minutes to 1 hour prior, reduction of caffeine prior to bedtime.  Recommended OTC sleep 3 extended release melatonin as needed and avoidance of medications containing diphenhydramine .  Patient verbalized understanding.  9. Elevated BP without diagnosis of hypertension BP improved with manual recheck after visit.  Recommend patient monitor blood pressure with home checks 2-3 times a week with goal of BP less than 130/80.  Will check with next visit in 2 to 3 months.  Patient to reach out to office if new, worrisome, or unresolved symptoms arise or if no improvement in patient's condition. Patient verbalized understanding and is agreeable to treatment plan. All questions answered to patient's satisfaction.    Return in about 2 months (around 08/20/2023) for ANNUAL PHYSICAL (fasting labs at visit) .    Nonda Bays, [place]

## 2023-06-21 ENCOUNTER — Ambulatory Visit (HOSPITAL_BASED_OUTPATIENT_CLINIC_OR_DEPARTMENT_OTHER): Payer: Self-pay | Admitting: Family Medicine

## 2023-06-21 DIAGNOSIS — J439 Emphysema, unspecified: Secondary | ICD-10-CM

## 2023-06-21 LAB — CERVICOVAGINAL ANCILLARY ONLY
Bacterial Vaginitis (gardnerella): NEGATIVE
Candida Glabrata: NEGATIVE
Candida Vaginitis: NEGATIVE
Comment: NEGATIVE
Comment: NEGATIVE
Comment: NEGATIVE

## 2023-06-21 NOTE — Progress Notes (Signed)
Vaginal swab is negative

## 2023-06-24 LAB — CYTOLOGY - PAP
Comment: NEGATIVE
Diagnosis: NEGATIVE
Diagnosis: REACTIVE
High risk HPV: NEGATIVE

## 2023-06-24 NOTE — Progress Notes (Signed)
 Pap Smear is normal. Health maintenance updated. Repeat pap in 3 years.

## 2023-07-02 ENCOUNTER — Ambulatory Visit (HOSPITAL_BASED_OUTPATIENT_CLINIC_OR_DEPARTMENT_OTHER)
Admission: RE | Admit: 2023-07-02 | Discharge: 2023-07-02 | Disposition: A | Discharge status: Home / Self Care | Payer: Self-pay | Source: Ambulatory Visit | Attending: Family Medicine | Admitting: Family Medicine

## 2023-07-02 ENCOUNTER — Encounter (HOSPITAL_BASED_OUTPATIENT_CLINIC_OR_DEPARTMENT_OTHER): Payer: Self-pay | Admitting: Radiology

## 2023-07-02 DIAGNOSIS — Z1231 Encounter for screening mammogram for malignant neoplasm of breast: Secondary | ICD-10-CM | POA: Insufficient documentation

## 2023-07-06 ENCOUNTER — Ambulatory Visit (HOSPITAL_BASED_OUTPATIENT_CLINIC_OR_DEPARTMENT_OTHER)
Admission: RE | Admit: 2023-07-06 | Discharge: 2023-07-06 | Disposition: A | Discharge status: Home / Self Care | Payer: Self-pay | Source: Ambulatory Visit | Attending: Family Medicine | Admitting: Family Medicine

## 2023-07-06 DIAGNOSIS — Z122 Encounter for screening for malignant neoplasm of respiratory organs: Secondary | ICD-10-CM | POA: Insufficient documentation

## 2023-07-08 ENCOUNTER — Other Ambulatory Visit: Payer: Self-pay | Admitting: Family Medicine

## 2023-07-08 DIAGNOSIS — R928 Other abnormal and inconclusive findings on diagnostic imaging of breast: Secondary | ICD-10-CM

## 2023-07-08 NOTE — Progress Notes (Signed)
 Hi Elaine Blake,  Your mammogram did show a finding in the right breast needing further imaging. I see they have already scheduled you for a Ultrasound of the right breast on 07/17/2023. I will receive your results and let you know as well. If you have further questions, please let me know.

## 2023-07-17 ENCOUNTER — Ambulatory Visit (HOSPITAL_BASED_OUTPATIENT_CLINIC_OR_DEPARTMENT_OTHER): Payer: Self-pay | Admitting: Family Medicine

## 2023-07-17 ENCOUNTER — Ambulatory Visit
Admission: RE | Admit: 2023-07-17 | Discharge: 2023-07-17 | Disposition: A | Discharge status: Home / Self Care | Source: Ambulatory Visit | Attending: Family Medicine | Admitting: Family Medicine

## 2023-07-17 DIAGNOSIS — R928 Other abnormal and inconclusive findings on diagnostic imaging of breast: Secondary | ICD-10-CM

## 2023-07-17 NOTE — Progress Notes (Signed)
 Hi Elaine Blake,  Your breast ultrasound showed a benign cyst to the right breast. We will resume your yearly mammograms for screening.

## 2023-07-21 DIAGNOSIS — J439 Emphysema, unspecified: Secondary | ICD-10-CM | POA: Insufficient documentation

## 2023-07-21 NOTE — Progress Notes (Signed)
 Hi Elaine Blake,  Your lung cancer screening is normal without signs of nodules. We will repeat this in 1 year for annual surveillance.

## 2023-10-02 ENCOUNTER — Encounter (HOSPITAL_BASED_OUTPATIENT_CLINIC_OR_DEPARTMENT_OTHER): Payer: Self-pay | Admitting: Family Medicine
